# Patient Record
Sex: Female | Born: 1955 | Race: White | Hispanic: No | Smoking: Former smoker
Health system: Southern US, Community
[De-identification: ages and names within clinical notes are randomized; demographics above are authoritative.]

## PROBLEM LIST (undated history)

## (undated) ENCOUNTER — Emergency Department

## (undated) DIAGNOSIS — K219 Gastro-esophageal reflux disease without esophagitis: Secondary | ICD-10-CM

## (undated) DIAGNOSIS — Z85828 Personal history of other malignant neoplasm of skin: Secondary | ICD-10-CM

## (undated) DIAGNOSIS — Z860100 Personal history of colon polyps, unspecified: Secondary | ICD-10-CM

## (undated) DIAGNOSIS — K529 Noninfective gastroenteritis and colitis, unspecified: Secondary | ICD-10-CM

## (undated) DIAGNOSIS — N200 Calculus of kidney: Secondary | ICD-10-CM

## (undated) DIAGNOSIS — Z87442 Personal history of urinary calculi: Secondary | ICD-10-CM

## (undated) DIAGNOSIS — Z8541 Personal history of malignant neoplasm of cervix uteri: Secondary | ICD-10-CM

## (undated) DIAGNOSIS — A630 Anogenital (venereal) warts: Secondary | ICD-10-CM

## (undated) DIAGNOSIS — M1990 Unspecified osteoarthritis, unspecified site: Secondary | ICD-10-CM

## (undated) DIAGNOSIS — Z9889 Other specified postprocedural states: Secondary | ICD-10-CM

## (undated) HISTORY — DX: Personal history of colon polyps, unspecified: Z86.0100

## (undated) HISTORY — PX: ABDOMINAL HYSTERECTOMY: SHX81

## (undated) HISTORY — DX: Unspecified osteoarthritis, unspecified site: M19.90

## (undated) HISTORY — PX: COLONOSCOPY W/ POLYPECTOMY: SHX1380

## (undated) HISTORY — DX: Personal history of colonic polyps: Z86.010

## (undated) HISTORY — PX: MOHS SURGERY: SUR867

---

## 1996-12-31 HISTORY — PX: CERVICAL CONIZATION W/BX: SHX1330

## 1997-12-31 HISTORY — PX: TOTAL ABDOMINAL HYSTERECTOMY W/ BILATERAL SALPINGOOPHORECTOMY: SHX83

## 1997-12-31 HISTORY — PX: EXTRACORPOREAL SHOCK WAVE LITHOTRIPSY: SHX1557

## 1998-05-12 ENCOUNTER — Other Ambulatory Visit: Admission: RE | Admit: 1998-05-12 | Discharge: 1998-05-12 | Payer: Self-pay | Admitting: Obstetrics and Gynecology

## 1998-07-11 ENCOUNTER — Inpatient Hospital Stay (HOSPITAL_COMMUNITY): Admission: RE | Admit: 1998-07-11 | Discharge: 1998-07-16 | Payer: Self-pay | Admitting: Obstetrics and Gynecology

## 1998-07-30 ENCOUNTER — Ambulatory Visit (HOSPITAL_COMMUNITY): Admission: EM | Admit: 1998-07-30 | Discharge: 1998-07-30 | Payer: Self-pay | Admitting: Emergency Medicine

## 1998-08-04 ENCOUNTER — Ambulatory Visit (HOSPITAL_COMMUNITY): Admission: RE | Admit: 1998-08-04 | Discharge: 1998-08-04 | Payer: Self-pay | Admitting: Urology

## 2000-02-19 ENCOUNTER — Other Ambulatory Visit: Admission: RE | Admit: 2000-02-19 | Discharge: 2000-02-19 | Payer: Self-pay | Admitting: Obstetrics and Gynecology

## 2000-03-12 ENCOUNTER — Ambulatory Visit (HOSPITAL_COMMUNITY): Admission: RE | Admit: 2000-03-12 | Discharge: 2000-03-12 | Payer: Self-pay | Admitting: Obstetrics and Gynecology

## 2001-04-24 ENCOUNTER — Other Ambulatory Visit: Admission: RE | Admit: 2001-04-24 | Discharge: 2001-04-24 | Payer: Self-pay | Admitting: Obstetrics and Gynecology

## 2003-12-22 ENCOUNTER — Ambulatory Visit (HOSPITAL_COMMUNITY): Admission: RE | Admit: 2003-12-22 | Discharge: 2003-12-22 | Payer: Self-pay | Admitting: Family Medicine

## 2010-07-24 ENCOUNTER — Emergency Department (HOSPITAL_COMMUNITY): Admission: EM | Admit: 2010-07-24 | Discharge: 2010-07-24 | Payer: Self-pay | Admitting: Family Medicine

## 2010-08-01 ENCOUNTER — Inpatient Hospital Stay (HOSPITAL_COMMUNITY): Admission: EM | Admit: 2010-08-01 | Discharge: 2010-08-04 | Payer: Self-pay | Admitting: Emergency Medicine

## 2010-08-10 ENCOUNTER — Ambulatory Visit: Payer: Self-pay | Admitting: Internal Medicine

## 2010-08-10 LAB — CONVERTED CEMR LAB
Basophils Absolute: 0.1 10*3/uL (ref 0.0–0.1)
Basophils Relative: 1 % (ref 0–1)
CRP: 0.9 mg/dL — ABNORMAL HIGH (ref <0.6)
Eosinophils Absolute: 0.2 10*3/uL (ref 0.0–0.7)
Eosinophils Relative: 3 % (ref 0–5)
Ferritin: 253 ng/mL (ref 10–291)
HCT: 35.9 % — ABNORMAL LOW (ref 36.0–46.0)
Helicobacter Pylori Antibody-IgG: 1.1 — ABNORMAL HIGH
Hemoglobin: 11.3 g/dL — ABNORMAL LOW (ref 12.0–15.0)
Hgb A1c MFr Bld: 5.9 % — ABNORMAL HIGH (ref <5.7)
Iron: 35 ug/dL — ABNORMAL LOW (ref 42–145)
Lymphocytes Relative: 27 % (ref 12–46)
Lymphs Abs: 2.3 10*3/uL (ref 0.7–4.0)
MCHC: 31.5 g/dL (ref 30.0–36.0)
MCV: 95.7 fL (ref 78.0–100.0)
Monocytes Absolute: 0.6 10*3/uL (ref 0.1–1.0)
Monocytes Relative: 7 % (ref 3–12)
Neutro Abs: 5.5 10*3/uL (ref 1.7–7.7)
Neutrophils Relative %: 63 % (ref 43–77)
Platelets: 658 10*3/uL — ABNORMAL HIGH (ref 150–400)
RBC: 3.75 M/uL — ABNORMAL LOW (ref 3.87–5.11)
RDW: 13.5 % (ref 11.5–15.5)
Saturation Ratios: 11 % — ABNORMAL LOW (ref 20–55)
TIBC: 321 ug/dL (ref 250–470)
TSH: 0.507 microintl units/mL (ref 0.350–4.500)
UIBC: 286 ug/dL
Vitamin B-12: 501 pg/mL (ref 211–911)
WBC: 8.7 10*3/uL (ref 4.0–10.5)

## 2010-08-25 ENCOUNTER — Ambulatory Visit: Payer: Self-pay | Admitting: Internal Medicine

## 2010-10-03 ENCOUNTER — Encounter (INDEPENDENT_AMBULATORY_CARE_PROVIDER_SITE_OTHER): Payer: Self-pay | Admitting: Internal Medicine

## 2010-10-17 ENCOUNTER — Ambulatory Visit: Payer: Self-pay | Admitting: Internal Medicine

## 2010-10-24 ENCOUNTER — Emergency Department (HOSPITAL_COMMUNITY): Admission: EM | Admit: 2010-10-24 | Discharge: 2010-10-24 | Payer: Self-pay | Admitting: Family Medicine

## 2010-10-24 ENCOUNTER — Ambulatory Visit (HOSPITAL_COMMUNITY): Admission: RE | Admit: 2010-10-24 | Discharge: 2010-10-24 | Payer: Self-pay | Admitting: Internal Medicine

## 2010-11-06 ENCOUNTER — Encounter: Admission: RE | Admit: 2010-11-06 | Discharge: 2010-11-06 | Payer: Self-pay | Admitting: Internal Medicine

## 2010-11-28 ENCOUNTER — Encounter (INDEPENDENT_AMBULATORY_CARE_PROVIDER_SITE_OTHER): Payer: Self-pay | Admitting: Internal Medicine

## 2010-11-28 LAB — CONVERTED CEMR LAB: Vit D, 25-Hydroxy: 26 ng/mL — ABNORMAL LOW (ref 30–89)

## 2011-03-16 LAB — BASIC METABOLIC PANEL
BUN: 11 mg/dL (ref 6–23)
CO2: 27 mEq/L (ref 19–32)
Calcium: 8.8 mg/dL (ref 8.4–10.5)
Chloride: 102 mEq/L (ref 96–112)
Creatinine, Ser: 0.97 mg/dL (ref 0.4–1.2)
GFR calc Af Amer: >60 mL/min (ref >60)
GFR calc non Af Amer: >60 mL/min (ref >60)
Glucose, Bld: 100 mg/dL — ABNORMAL HIGH (ref 70–99)
Potassium: 3.6 mEq/L (ref 3.5–5.1)
Sodium: 137 mEq/L (ref 135–145)

## 2011-03-16 LAB — CBC
HCT: 25.3 % — ABNORMAL LOW (ref 36.0–46.0)
HCT: 27.8 % — ABNORMAL LOW (ref 36.0–46.0)
HCT: 28.3 % — ABNORMAL LOW (ref 36.0–46.0)
HCT: 32.4 % — ABNORMAL LOW (ref 36.0–46.0)
Hemoglobin: 11.2 g/dL — ABNORMAL LOW (ref 12.0–15.0)
Hemoglobin: 8.8 g/dL — ABNORMAL LOW (ref 12.0–15.0)
Hemoglobin: 9.6 g/dL — ABNORMAL LOW (ref 12.0–15.0)
Hemoglobin: 9.6 g/dL — ABNORMAL LOW (ref 12.0–15.0)
MCH: 31.4 pg (ref 26.0–34.0)
MCH: 31.5 pg (ref 26.0–34.0)
MCH: 31.6 pg (ref 26.0–34.0)
MCHC: 34 g/dL (ref 30.0–36.0)
MCHC: 34.5 g/dL (ref 30.0–36.0)
MCHC: 34.7 g/dL (ref 30.0–36.0)
MCHC: 34.9 g/dL (ref 30.0–36.0)
MCV: 90.1 fL (ref 78.0–100.0)
MCV: 91.2 fL (ref 78.0–100.0)
Platelets: 244 10*3/uL (ref 150–400)
Platelets: 308 10*3/uL (ref 150–400)
RBC: 2.81 MIL/uL — ABNORMAL LOW (ref 3.87–5.11)
RBC: 3.09 MIL/uL — ABNORMAL LOW (ref 3.87–5.11)
RBC: 3.55 MIL/uL — ABNORMAL LOW (ref 3.87–5.11)
RDW: 12.8 % (ref 11.5–15.5)
RDW: 12.8 % (ref 11.5–15.5)
RDW: 12.8 % (ref 11.5–15.5)
WBC: 15.3 10*3/uL — ABNORMAL HIGH (ref 4.0–10.5)
WBC: 22.3 10*3/uL — ABNORMAL HIGH (ref 4.0–10.5)
WBC: 9.3 10*3/uL (ref 4.0–10.5)

## 2011-03-16 LAB — DIFFERENTIAL
Basophils Absolute: 0 10*3/uL (ref 0.0–0.1)
Basophils Absolute: 0 10*3/uL (ref 0.0–0.1)
Basophils Relative: 0 % (ref 0–1)
Basophils Relative: 0 % (ref 0–1)
Eosinophils Absolute: 0 10*3/uL (ref 0.0–0.7)
Eosinophils Absolute: 0.1 10*3/uL (ref 0.0–0.7)
Eosinophils Relative: 0 % (ref 0–5)
Eosinophils Relative: 1 % (ref 0–5)
Lymphocytes Relative: 10 % — ABNORMAL LOW (ref 12–46)
Lymphocytes Relative: 8 % — ABNORMAL LOW (ref 12–46)
Lymphs Abs: 1.5 10*3/uL (ref 0.7–4.0)
Lymphs Abs: 1.7 10*3/uL (ref 0.7–4.0)
Monocytes Absolute: 1.4 10*3/uL — ABNORMAL HIGH (ref 0.1–1.0)
Monocytes Absolute: 1.4 10*3/uL — ABNORMAL HIGH (ref 0.1–1.0)
Monocytes Relative: 6 % (ref 3–12)
Monocytes Relative: 9 % (ref 3–12)
Neutro Abs: 12.2 10*3/uL — ABNORMAL HIGH (ref 1.7–7.7)
Neutro Abs: 19.1 10*3/uL — ABNORMAL HIGH (ref 1.7–7.7)
Neutrophils Relative %: 80 % — ABNORMAL HIGH (ref 43–77)
Neutrophils Relative %: 86 % — ABNORMAL HIGH (ref 43–77)

## 2011-03-16 LAB — URINE CULTURE
Colony Count: >=100000
Culture  Setup Time: 201108030057

## 2011-03-16 LAB — URINALYSIS, ROUTINE W REFLEX MICROSCOPIC
Bilirubin Urine: NEGATIVE
Glucose, UA: NEGATIVE mg/dL
Ketones, ur: NEGATIVE mg/dL
Nitrite: NEGATIVE
Protein, ur: 100 mg/dL — AB
Specific Gravity, Urine: 1.019 (ref 1.005–1.030)
Urobilinogen, UA: 1 mg/dL (ref 0.0–1.0)
pH: 5.5 (ref 5.0–8.0)

## 2011-03-16 LAB — TSH: TSH: 0.859 u[IU]/mL (ref 0.350–4.500)

## 2011-03-16 LAB — IRON AND TIBC
Iron: 12 ug/dL — ABNORMAL LOW (ref 42–135)
Saturation Ratios: 7 % — ABNORMAL LOW (ref 20–55)
TIBC: 170 ug/dL — ABNORMAL LOW (ref 250–470)
UIBC: 158 ug/dL

## 2011-03-16 LAB — COMPREHENSIVE METABOLIC PANEL
ALT: 16 U/L (ref 0–35)
BUN: 4 mg/dL — ABNORMAL LOW (ref 6–23)
CO2: 25 mEq/L (ref 19–32)
Calcium: 8.5 mg/dL (ref 8.4–10.5)
Creatinine, Ser: 0.84 mg/dL (ref 0.4–1.2)
GFR calc non Af Amer: >60 mL/min (ref >60)
Glucose, Bld: 107 mg/dL — ABNORMAL HIGH (ref 70–99)
Sodium: 143 mEq/L (ref 135–145)
Total Protein: 6.2 g/dL (ref 6.0–8.3)

## 2011-03-16 LAB — FOLATE: Folate: >20 ng/mL

## 2011-03-16 LAB — CULTURE, BLOOD (ROUTINE X 2)
Culture: NO GROWTH
Culture: NO GROWTH

## 2011-03-16 LAB — URINE MICROSCOPIC-ADD ON

## 2011-03-16 LAB — VITAMIN B12: Vitamin B-12: 508 pg/mL (ref 211–911)

## 2011-03-16 LAB — MRSA PCR SCREENING: MRSA by PCR: NEGATIVE

## 2011-03-16 LAB — T4, FREE: Free T4: 1.16 ng/dL (ref 0.80–1.80)

## 2011-03-16 LAB — HEMOCCULT GUIAC POC 1CARD (OFFICE): Fecal Occult Bld: NEGATIVE

## 2011-03-16 LAB — RETICULOCYTES: Retic Ct Pct: 1 % (ref 0.4–3.1)

## 2013-10-22 ENCOUNTER — Other Ambulatory Visit: Payer: Self-pay | Admitting: Internal Medicine

## 2013-10-22 DIAGNOSIS — M549 Dorsalgia, unspecified: Secondary | ICD-10-CM

## 2013-10-22 DIAGNOSIS — Z1231 Encounter for screening mammogram for malignant neoplasm of breast: Secondary | ICD-10-CM

## 2013-10-22 DIAGNOSIS — R1011 Right upper quadrant pain: Secondary | ICD-10-CM

## 2013-10-29 ENCOUNTER — Other Ambulatory Visit (HOSPITAL_COMMUNITY): Payer: Self-pay | Admitting: Internal Medicine

## 2013-10-29 ENCOUNTER — Other Ambulatory Visit: Payer: Self-pay | Admitting: Internal Medicine

## 2013-10-29 ENCOUNTER — Ambulatory Visit
Admission: RE | Admit: 2013-10-29 | Discharge: 2013-10-29 | Disposition: A | Discharge status: Home / Self Care | Payer: BC Managed Care – PPO | Source: Ambulatory Visit | Attending: Internal Medicine | Admitting: Internal Medicine

## 2013-10-29 DIAGNOSIS — R140 Abdominal distension (gaseous): Secondary | ICD-10-CM

## 2013-10-29 DIAGNOSIS — M549 Dorsalgia, unspecified: Secondary | ICD-10-CM

## 2013-10-29 DIAGNOSIS — R1011 Right upper quadrant pain: Secondary | ICD-10-CM

## 2013-10-29 DIAGNOSIS — M545 Low back pain: Secondary | ICD-10-CM

## 2013-11-03 ENCOUNTER — Ambulatory Visit
Admission: RE | Admit: 2013-11-03 | Discharge: 2013-11-03 | Disposition: A | Discharge status: Home / Self Care | Payer: BC Managed Care – PPO | Source: Ambulatory Visit | Attending: Internal Medicine | Admitting: Internal Medicine

## 2013-11-03 DIAGNOSIS — Z1231 Encounter for screening mammogram for malignant neoplasm of breast: Secondary | ICD-10-CM

## 2013-11-05 ENCOUNTER — Other Ambulatory Visit: Payer: Self-pay | Admitting: Internal Medicine

## 2013-11-05 DIAGNOSIS — R928 Other abnormal and inconclusive findings on diagnostic imaging of breast: Secondary | ICD-10-CM

## 2013-11-10 ENCOUNTER — Encounter (HOSPITAL_COMMUNITY)
Admission: RE | Admit: 2013-11-10 | Discharge: 2013-11-10 | Disposition: A | Discharge status: Home / Self Care | Payer: BC Managed Care – PPO | Source: Ambulatory Visit | Attending: Internal Medicine | Admitting: Internal Medicine

## 2013-11-10 DIAGNOSIS — R140 Abdominal distension (gaseous): Secondary | ICD-10-CM

## 2013-11-10 DIAGNOSIS — R141 Gas pain: Secondary | ICD-10-CM | POA: Insufficient documentation

## 2013-11-10 DIAGNOSIS — R112 Nausea with vomiting, unspecified: Secondary | ICD-10-CM | POA: Insufficient documentation

## 2013-11-10 DIAGNOSIS — R1011 Right upper quadrant pain: Secondary | ICD-10-CM | POA: Insufficient documentation

## 2013-11-10 DIAGNOSIS — R142 Eructation: Secondary | ICD-10-CM | POA: Insufficient documentation

## 2013-11-10 MED ORDER — TECHNETIUM TC 99M MEBROFENIN IV KIT
5.0000 | PACK | Freq: Once | INTRAVENOUS | Status: AC | PRN
  Administered 2013-11-10: 5 via INTRAVENOUS

## 2013-11-10 MED ORDER — SINCALIDE 5 MCG IJ SOLR
0.0200 ug/kg | Freq: Once | INTRAMUSCULAR | Status: AC
  Administered 2013-11-10: 1.14 ug via INTRAVENOUS

## 2013-11-10 MED ORDER — SINCALIDE 5 MCG IJ SOLR
INTRAMUSCULAR | Status: AC
  Administered 2013-11-10: 1.14 ug via INTRAVENOUS
  Filled 2013-11-10: qty 5

## 2013-11-25 ENCOUNTER — Other Ambulatory Visit: Payer: BC Managed Care – PPO

## 2013-11-30 ENCOUNTER — Ambulatory Visit
Admission: RE | Admit: 2013-11-30 | Discharge: 2013-11-30 | Disposition: A | Discharge status: Home / Self Care | Payer: BC Managed Care – PPO | Source: Ambulatory Visit | Attending: Internal Medicine | Admitting: Internal Medicine

## 2013-11-30 DIAGNOSIS — R928 Other abnormal and inconclusive findings on diagnostic imaging of breast: Secondary | ICD-10-CM

## 2013-12-07 ENCOUNTER — Ambulatory Visit (INDEPENDENT_AMBULATORY_CARE_PROVIDER_SITE_OTHER): Payer: Self-pay | Admitting: General Surgery

## 2013-12-11 ENCOUNTER — Encounter (INDEPENDENT_AMBULATORY_CARE_PROVIDER_SITE_OTHER): Payer: Self-pay

## 2013-12-14 ENCOUNTER — Ambulatory Visit (INDEPENDENT_AMBULATORY_CARE_PROVIDER_SITE_OTHER): Payer: BC Managed Care – PPO | Admitting: General Surgery

## 2013-12-14 ENCOUNTER — Ambulatory Visit (INDEPENDENT_AMBULATORY_CARE_PROVIDER_SITE_OTHER): Payer: Self-pay | Admitting: General Surgery

## 2013-12-14 ENCOUNTER — Encounter (INDEPENDENT_AMBULATORY_CARE_PROVIDER_SITE_OTHER): Payer: Self-pay | Admitting: General Surgery

## 2013-12-14 ENCOUNTER — Encounter (INDEPENDENT_AMBULATORY_CARE_PROVIDER_SITE_OTHER): Payer: Self-pay

## 2013-12-14 ENCOUNTER — Telehealth (INDEPENDENT_AMBULATORY_CARE_PROVIDER_SITE_OTHER): Payer: Self-pay | Admitting: General Surgery

## 2013-12-14 VITALS — BP 126/78 | HR 72 | Temp 98.2°F | Resp 14 | Ht 64.5 in | Wt 129.4 lb

## 2013-12-14 DIAGNOSIS — K811 Chronic cholecystitis: Secondary | ICD-10-CM

## 2013-12-14 NOTE — Patient Instructions (Signed)

## 2013-12-14 NOTE — Telephone Encounter (Signed)
Patient met with surgery scheduling, went over financial responsibilities, patient will call back to schedule °

## 2013-12-15 DIAGNOSIS — K811 Chronic cholecystitis: Secondary | ICD-10-CM | POA: Insufficient documentation

## 2013-12-15 NOTE — Assessment & Plan Note (Signed)
Despite having a normal ultrasound and a gallbladder ejection fraction of 32%, I do think the patient has chronic cholecystitis based on her tenderness and the replication of symptoms with CCK infusion.  I advised the patient that she would most likely benefit from cholecystectomy. I did state that in patients with atypical symptoms or normal radiologic studies, that there is a small proportion of people that does not improve with cholecystectomy. I also advised her that the bloating and belching may not improve. These frequently improve with cholecystectomy, but these can be related to other causes. Her main target would be to improve her pain and nausea.  The surgical procedure was described to the patient in detail.  The patient was given Agricultural engineer. .  I discussed the incision type and location, the location of the gallbladder, the anatomy of the bile ducts and arteries, and the typical progression of surgery.  I discussed the possibility of converting to an open operation.  I advised of the risks of bleeding, infection, damage to other structures (such as the bile duct, intestine or liver), bile leak, need for other procedures or surgeries, and post op diarrhea/constipation.  We discussed the risk of blood clot.  We discussed the recovery period and post operative restrictions.  The patient was advised against taking blood thinners the week before surgery.

## 2013-12-15 NOTE — Progress Notes (Signed)
Chief Complaint  Patient presents with  . New Evaluation    eval GB ... low EF    HISTORY: Patient is a 57-year-old female who presents with abdominal pain, nausea and vomiting. She states that she has had belching for several years. However, since September she is started having significant increase in this in addition to episodes of severe right upper quadrant epigastric pain that are associated with nausea and vomiting. She states that some of these have been postprandial in nature but that they can occur any time. She has noticed that milk seems to make this worse and has taken this out of her diet. She has a sister and her mother that have both had gallbladder disease in addition to a maternal aunt and a maternal niece. She states that her sister had the dysfunction of the gallbladder, however her mother had severe infection and required exploratory laparotomy with cholecystectomy. The patient denies fevers and chills. She has not had any diarrhea or other changes in her bowel habits.  Past Medical History  Diagnosis Date  . Biliary dyskinesia   . RUQ pain   . Abdominal pain   . Nausea and vomiting   . History of colon polyps   . Bloating   . Anemia   . Arthritis   . Cancer     cervical , basal cell carcinoma    Past Surgical History  Procedure Laterality Date  . Colonoscopy    . Colonoscopy w/ polypectomy      2 x  . Abdominal hysterectomy    . Nose surgery      basil cell carcinoma removed   . Laser ablation/cauterization of endometrial implants      No current outpatient prescriptions on file.   No current facility-administered medications for this visit.     Allergies  Allergen Reactions  . Penicillins      Family History  Problem Relation Age of Onset  . Cancer Mother     uterus  . Cancer Paternal Aunt     breast     History   Social History  . Marital Status: Divorced    Spouse Name: N/A    Number of Children: N/A  . Years of Education: N/A    Social History Main Topics  . Smoking status: Former Smoker    Quit date: 01/01/2012  . Smokeless tobacco: Never Used  . Alcohol Use: Yes     Comment: occasionally  . Drug Use: No  . Sexual Activity: None    REVIEW OF SYSTEMS - PERTINENT POSITIVES ONLY: 12 point review of systems negative other than HPI and PMH except for abdominal distention  EXAM: Filed Vitals:   12/14/13 1017  BP: 126/78  Pulse: 72  Temp: 98.2 F (36.8 C)  Resp: 14   Filed Weights   12/14/13 1017  Weight: 129 lb 6.1 oz (58.686 kg)     Gen:  No acute distress.  Well nourished and well groomed.   Neurological: Alert and oriented to person, place, and time. Coordination normal.  Head: Normocephalic and atraumatic.  Eyes: Conjunctivae are normal. Pupils are equal, round, and reactive to light. No scleral icterus.  Neck: Normal range of motion. Neck supple. No tracheal deviation or thyromegaly present.  Cardiovascular: Normal rate, regular rhythm, normal heart sounds and intact distal pulses.  Exam reveals no gallop and no friction rub.  No murmur heard. Respiratory: Effort normal.  No respiratory distress. No chest wall tenderness. Breath sounds normal.  No wheezes,   rales or rhonchi.  GI: Soft. Bowel sounds are normal. The abdomen is soft and nondistended.  There is RUQ tenderness.  There is no rebound and no guarding.  Musculoskeletal: Normal range of motion. Extremities are nontender.  Lymphadenopathy: No cervical, preauricular, postauricular or axillary adenopathy is present Skin: Skin is warm and dry. No rash noted. No diaphoresis. No erythema. No pallor. No clubbing, cyanosis, or edema.   Psychiatric: Normal mood and affect. Behavior is normal. Judgment and thought content normal.    LABORATORY RESULTS: Available labs are reviewed    RADIOLOGY RESULTS: See E-Chart or I-Site for most recent results.  Images and reports are reviewed.    ASSESSMENT AND PLAN: Chronic cholecystitis Despite  having a normal ultrasound and a gallbladder ejection fraction of 32%, I do think the patient has chronic cholecystitis based on her tenderness and the replication of symptoms with CCK infusion.  I advised the patient that she would most likely benefit from cholecystectomy. I did state that in patients with atypical symptoms or normal radiologic studies, that there is a small proportion of people that does not improve with cholecystectomy. I also advised her that the bloating and belching may not improve. These frequently improve with cholecystectomy, but these can be related to other causes. Her main target would be to improve her pain and nausea.  The surgical procedure was described to the patient in detail.  The patient was given educational material. .  I discussed the incision type and location, the location of the gallbladder, the anatomy of the bile ducts and arteries, and the typical progression of surgery.  I discussed the possibility of converting to an open operation.  I advised of the risks of bleeding, infection, damage to other structures (such as the bile duct, intestine or liver), bile leak, need for other procedures or surgeries, and post op diarrhea/constipation.  We discussed the risk of blood clot.  We discussed the recovery period and post operative restrictions.  The patient was advised against taking blood thinners the week before surgery.      40 min spent in evaluation, examination, and counseling.  >50% of visit spent in counseling.      Nikiah Goin L Davaris Youtsey MD Surgical Oncology, General and Endocrine Surgery [facility]      Visit Diagnoses: 1. Chronic cholecystitis     Primary Care Physician: MOREIRA,ROY, MD    

## 2013-12-17 ENCOUNTER — Encounter (HOSPITAL_COMMUNITY): Payer: Self-pay | Admitting: Pharmacy Technician

## 2013-12-17 NOTE — Patient Instructions (Addendum)
21 Brittney Santos  12/17/2013   Your procedure is scheduled on: Tuesday December 30th  Report to [facility] at 930  AM.  Call this number if you have problems the morning of surgery [phone]   Remember:   Do not eat food or drink liquids :After Midnight.     Take these medicines the morning of surgery with A SIP OF WATER: no medications to take                                SEE [facility] PREPARING FOR SURGERY SHEET             You may not have any metal on your body including hair pins and piercings  Do not wear jewelry, make-up.  Do not wear lotions, powders, or perfumes. You may wear deodorant.   Men may shave face and neck.  Do not bring valuables to the hospital. [facility] IS NOT RESPONSIBLE FOR VALUEABLES.  Contacts, dentures or bridgework may not be worn into surgery.  Leave suitcase in the car. After surgery it may be brought to your room.  For patients admitted to the hospital, checkout time is 11:00 AM the day of discharge.   Patients discharged the day of surgery will not be allowed to drive home.  Name and phone number of your driver: sherry adams sister cell [phone]  Special Instructions: N/A   Please read over the following fact sheets that you were given: [facility] preparing for surgery sheet  Call Cain Sieve RN pre op nurse if needed [phone]    FAILURE TO FOLLOW THESE INSTRUCTIONS MAY RESULT IN THE CANCELLATION OF YOUR SURGERY.  PATIENT SIGNATURE___________________________________________  NURSE SIGNATURE_____________________________________________

## 2013-12-17 NOTE — Progress Notes (Signed)
ekg dr Ludwig Clarks on chart

## 2013-12-18 ENCOUNTER — Encounter (HOSPITAL_COMMUNITY): Payer: Self-pay

## 2013-12-18 ENCOUNTER — Encounter (HOSPITAL_COMMUNITY)
Admission: RE | Admit: 2013-12-18 | Discharge: 2013-12-18 | Disposition: A | Discharge status: Home / Self Care | Payer: BC Managed Care – PPO | Source: Ambulatory Visit | Attending: General Surgery | Admitting: General Surgery

## 2013-12-18 DIAGNOSIS — Z01812 Encounter for preprocedural laboratory examination: Secondary | ICD-10-CM | POA: Insufficient documentation

## 2013-12-18 HISTORY — DX: Personal history of urinary calculi: Z87.442

## 2013-12-18 LAB — CBC WITH DIFFERENTIAL/PLATELET
Eosinophils Relative: 4 % (ref 0–5)
HCT: 35.7 % — ABNORMAL LOW (ref 36.0–46.0)
Hemoglobin: 11.9 g/dL — ABNORMAL LOW (ref 12.0–15.0)
Lymphocytes Relative: 32 % (ref 12–46)
Lymphs Abs: 1.7 10*3/uL (ref 0.7–4.0)
MCH: 29.2 pg (ref 26.0–34.0)
MCHC: 33.3 g/dL (ref 30.0–36.0)
MCV: 87.5 fL (ref 78.0–100.0)
Monocytes Relative: 10 % (ref 3–12)
Neutro Abs: 2.9 10*3/uL (ref 1.7–7.7)
Neutrophils Relative %: 54 % (ref 43–77)
RBC: 4.08 MIL/uL (ref 3.87–5.11)

## 2013-12-18 LAB — URINALYSIS, ROUTINE W REFLEX MICROSCOPIC
Bilirubin Urine: NEGATIVE
Glucose, UA: NEGATIVE mg/dL
Ketones, ur: NEGATIVE mg/dL
Nitrite: NEGATIVE
Protein, ur: NEGATIVE mg/dL

## 2013-12-18 LAB — COMPREHENSIVE METABOLIC PANEL
AST: 17 U/L (ref 0–37)
Alkaline Phosphatase: 109 U/L (ref 39–117)
BUN: 10 mg/dL (ref 6–23)
Chloride: 105 mEq/L (ref 96–112)
Creatinine, Ser: 0.74 mg/dL (ref 0.50–1.10)
GFR calc Af Amer: >90 mL/min (ref >90)
Glucose, Bld: 88 mg/dL (ref 70–99)
Potassium: 4.6 mEq/L (ref 3.5–5.1)
Total Bilirubin: 0.3 mg/dL (ref 0.3–1.2)
Total Protein: 7.4 g/dL (ref 6.0–8.3)

## 2013-12-18 LAB — URINE MICROSCOPIC-ADD ON

## 2013-12-18 NOTE — Progress Notes (Signed)
Micro ua results routed by epic to dr Donell Beers inbox

## 2013-12-29 ENCOUNTER — Ambulatory Visit (HOSPITAL_COMMUNITY)
Admission: RE | Admit: 2013-12-29 | Discharge: 2013-12-29 | Disposition: A | Discharge status: Home / Self Care | Payer: BC Managed Care – PPO | Source: Ambulatory Visit | Attending: General Surgery | Admitting: General Surgery

## 2013-12-29 ENCOUNTER — Ambulatory Visit (HOSPITAL_COMMUNITY): Payer: BC Managed Care – PPO | Admitting: Anesthesiology

## 2013-12-29 ENCOUNTER — Encounter (HOSPITAL_COMMUNITY): Payer: BC Managed Care – PPO | Admitting: Anesthesiology

## 2013-12-29 ENCOUNTER — Encounter (HOSPITAL_COMMUNITY)
Admission: RE | Disposition: A | Discharge status: Home / Self Care | Payer: Self-pay | Source: Ambulatory Visit | Attending: General Surgery

## 2013-12-29 ENCOUNTER — Encounter (HOSPITAL_COMMUNITY): Payer: Self-pay | Admitting: *Deleted

## 2013-12-29 DIAGNOSIS — K811 Chronic cholecystitis: Secondary | ICD-10-CM | POA: Insufficient documentation

## 2013-12-29 DIAGNOSIS — Z8673 Personal history of transient ischemic attack (TIA), and cerebral infarction without residual deficits: Secondary | ICD-10-CM | POA: Insufficient documentation

## 2013-12-29 DIAGNOSIS — Z8541 Personal history of malignant neoplasm of cervix uteri: Secondary | ICD-10-CM | POA: Insufficient documentation

## 2013-12-29 HISTORY — PX: CHOLECYSTECTOMY: SHX55

## 2013-12-29 SURGERY — LAPAROSCOPIC CHOLECYSTECTOMY
Anesthesia: General | Site: Abdomen

## 2013-12-29 MED ORDER — KETOROLAC TROMETHAMINE 30 MG/ML IJ SOLN: 15.0000 mg | Freq: Once | INTRAMUSCULAR | Status: DC | PRN

## 2013-12-29 MED ORDER — LIDOCAINE HCL 1 % IJ SOLN
INTRAMUSCULAR | Status: AC
  Filled 2013-12-29: qty 20

## 2013-12-29 MED ORDER — FENTANYL CITRATE 0.05 MG/ML IJ SOLN
INTRAMUSCULAR | Status: DC | PRN
  Administered 2013-12-29: 100 ug via INTRAVENOUS
  Administered 2013-12-29: 50 ug via INTRAVENOUS
  Administered 2013-12-29: 100 ug via INTRAVENOUS

## 2013-12-29 MED ORDER — FENTANYL CITRATE 0.05 MG/ML IJ SOLN
INTRAMUSCULAR | Status: AC
  Filled 2013-12-29: qty 5

## 2013-12-29 MED ORDER — ATROPINE SULFATE 0.4 MG/ML IJ SOLN
INTRAMUSCULAR | Status: DC | PRN
  Administered 2013-12-29: 0.4 mg via INTRAVENOUS

## 2013-12-29 MED ORDER — HYDROMORPHONE HCL PF 1 MG/ML IJ SOLN: 0.2500 mg | INTRAMUSCULAR | Status: DC | PRN

## 2013-12-29 MED ORDER — ROCURONIUM BROMIDE 100 MG/10ML IV SOLN
INTRAVENOUS | Status: AC
  Filled 2013-12-29: qty 1

## 2013-12-29 MED ORDER — BUPIVACAINE-EPINEPHRINE 0.25% -1:200000 IJ SOLN
INTRAMUSCULAR | Status: DC | PRN
  Administered 2013-12-29: 9 mL

## 2013-12-29 MED ORDER — LACTATED RINGERS IR SOLN
Status: DC | PRN
  Administered 2013-12-29: 1000 mL

## 2013-12-29 MED ORDER — DEXAMETHASONE SODIUM PHOSPHATE 10 MG/ML IJ SOLN
INTRAMUSCULAR | Status: DC | PRN
  Administered 2013-12-29: 10 mg via INTRAVENOUS

## 2013-12-29 MED ORDER — EPHEDRINE SULFATE 50 MG/ML IJ SOLN
INTRAMUSCULAR | Status: DC | PRN
  Administered 2013-12-29: 10 mg via INTRAVENOUS
  Administered 2013-12-29: 5 mg via INTRAVENOUS

## 2013-12-29 MED ORDER — CIPROFLOXACIN IN D5W 400 MG/200ML IV SOLN
INTRAVENOUS | Status: AC
  Filled 2013-12-29: qty 200

## 2013-12-29 MED ORDER — DEXAMETHASONE SODIUM PHOSPHATE 10 MG/ML IJ SOLN
INTRAMUSCULAR | Status: AC
  Filled 2013-12-29: qty 1

## 2013-12-29 MED ORDER — MIDAZOLAM HCL 5 MG/5ML IJ SOLN
INTRAMUSCULAR | Status: DC | PRN
  Administered 2013-12-29: 2 mg via INTRAVENOUS

## 2013-12-29 MED ORDER — CIPROFLOXACIN IN D5W 400 MG/200ML IV SOLN
400.0000 mg | INTRAVENOUS | Status: AC
  Administered 2013-12-29: 400 mg via INTRAVENOUS

## 2013-12-29 MED ORDER — ESMOLOL HCL 10 MG/ML IV SOLN
INTRAVENOUS | Status: DC | PRN
  Administered 2013-12-29 (×2): 20 mg via INTRAVENOUS

## 2013-12-29 MED ORDER — PROPOFOL 10 MG/ML IV BOLUS
INTRAVENOUS | Status: DC | PRN
  Administered 2013-12-29: 200 mg via INTRAVENOUS

## 2013-12-29 MED ORDER — PROMETHAZINE HCL 25 MG/ML IJ SOLN: 6.2500 mg | INTRAMUSCULAR | Status: DC | PRN

## 2013-12-29 MED ORDER — ESMOLOL HCL 10 MG/ML IV SOLN
INTRAVENOUS | Status: AC
  Filled 2013-12-29: qty 10

## 2013-12-29 MED ORDER — GLYCOPYRROLATE 0.2 MG/ML IJ SOLN
INTRAMUSCULAR | Status: DC | PRN
  Administered 2013-12-29: 0.4 mg via INTRAVENOUS

## 2013-12-29 MED ORDER — LIDOCAINE HCL (CARDIAC) 20 MG/ML IV SOLN
INTRAVENOUS | Status: AC
  Filled 2013-12-29: qty 5

## 2013-12-29 MED ORDER — LACTATED RINGERS IV SOLN
INTRAVENOUS | Status: DC
  Administered 2013-12-29: 12:00:00 via INTRAVENOUS

## 2013-12-29 MED ORDER — GLYCOPYRROLATE 0.2 MG/ML IJ SOLN
INTRAMUSCULAR | Status: AC
  Filled 2013-12-29: qty 2

## 2013-12-29 MED ORDER — LIDOCAINE HCL (PF) 2 % IJ SOLN
INTRAMUSCULAR | Status: DC | PRN
  Administered 2013-12-29: 75 mg via INTRADERMAL

## 2013-12-29 MED ORDER — LIDOCAINE HCL 1 % IJ SOLN
INTRAMUSCULAR | Status: DC | PRN
  Administered 2013-12-29: 9 mL

## 2013-12-29 MED ORDER — OXYCODONE-ACETAMINOPHEN 5-325 MG PO TABS: 1.0000 | ORAL_TABLET | ORAL | Status: DC | PRN

## 2013-12-29 MED ORDER — MIDAZOLAM HCL 2 MG/2ML IJ SOLN
INTRAMUSCULAR | Status: AC
  Filled 2013-12-29: qty 2

## 2013-12-29 MED ORDER — BUPIVACAINE-EPINEPHRINE PF 0.25-1:200000 % IJ SOLN
INTRAMUSCULAR | Status: AC
  Filled 2013-12-29: qty 30

## 2013-12-29 MED ORDER — ONDANSETRON HCL 4 MG/2ML IJ SOLN
INTRAMUSCULAR | Status: AC
  Filled 2013-12-29: qty 2

## 2013-12-29 MED ORDER — PROPOFOL 10 MG/ML IV BOLUS
INTRAVENOUS | Status: AC
  Filled 2013-12-29: qty 20

## 2013-12-29 MED ORDER — LACTATED RINGERS IV SOLN
INTRAVENOUS | Status: DC | PRN
  Administered 2013-12-29: 10:00:00 via INTRAVENOUS

## 2013-12-29 MED ORDER — ONDANSETRON HCL 4 MG/2ML IJ SOLN
INTRAMUSCULAR | Status: DC | PRN
  Administered 2013-12-29: 4 mg via INTRAVENOUS

## 2013-12-29 MED ORDER — NEOSTIGMINE METHYLSULFATE 1 MG/ML IJ SOLN
INTRAMUSCULAR | Status: DC | PRN
  Administered 2013-12-29: 4 mg via INTRAVENOUS

## 2013-12-29 MED ORDER — 0.9 % SODIUM CHLORIDE (POUR BTL) OPTIME
TOPICAL | Status: DC | PRN
  Administered 2013-12-29: 1000 mL

## 2013-12-29 MED ORDER — ROCURONIUM BROMIDE 100 MG/10ML IV SOLN
INTRAVENOUS | Status: DC | PRN
  Administered 2013-12-29: 40 mg via INTRAVENOUS
  Administered 2013-12-29: 10 mg via INTRAVENOUS

## 2013-12-29 SURGICAL SUPPLY — 35 items
APPLIER CLIP 5 13 M/L LIGAMAX5 (MISCELLANEOUS)
APPLIER CLIP ROT 10 11.4 M/L (STAPLE)
CANISTER SUCTION 2500CC (MISCELLANEOUS) ×2 IMPLANT
CHLORAPREP W/TINT 26ML (MISCELLANEOUS) ×2 IMPLANT
CLIP APPLIE 5 13 M/L LIGAMAX5 (MISCELLANEOUS) IMPLANT
CLIP APPLIE ROT 10 11.4 M/L (STAPLE) IMPLANT
CLIP LIGATING HEMO O LOK GREEN (MISCELLANEOUS) IMPLANT
COVER MAYO STAND STRL (DRAPES) IMPLANT
DECANTER SPIKE VIAL GLASS SM (MISCELLANEOUS) ×2 IMPLANT
DERMABOND ADVANCED (GAUZE/BANDAGES/DRESSINGS) ×1
DERMABOND ADVANCED .7 DNX12 (GAUZE/BANDAGES/DRESSINGS) ×1 IMPLANT
DRAPE C-ARM 42X120 X-RAY (DRAPES) IMPLANT
DRAPE LAPAROSCOPIC ABDOMINAL (DRAPES) ×2 IMPLANT
DRAPE UTILITY XL STRL (DRAPES) ×2 IMPLANT
ELECT REM PT RETURN 9FT ADLT (ELECTROSURGICAL) ×2
ELECTRODE REM PT RTRN 9FT ADLT (ELECTROSURGICAL) ×1 IMPLANT
GLOVE BIO SURGEON STRL SZ 6 (GLOVE) ×2 IMPLANT
GLOVE INDICATOR 6.5 STRL GRN (GLOVE) ×2 IMPLANT
GOWN BRE IMP PREV XXLGXLNG (GOWN DISPOSABLE) ×2 IMPLANT
GOWN STRL REIN XL XLG (GOWN DISPOSABLE) ×4 IMPLANT
KIT BASIN OR (CUSTOM PROCEDURE TRAY) ×2 IMPLANT
POUCH SPECIMEN RETRIEVAL 10MM (ENDOMECHANICALS) ×2 IMPLANT
SCISSORS ENDO CVD 5DCS (MISCELLANEOUS) ×2 IMPLANT
SET CHOLANGIOGRAPH MIX (MISCELLANEOUS) IMPLANT
SET IRRIG TUBING LAPAROSCOPIC (IRRIGATION / IRRIGATOR) ×2 IMPLANT
SOLUTION ANTI FOG 6CC (MISCELLANEOUS) ×2 IMPLANT
SUT MNCRL AB 4-0 PS2 18 (SUTURE) ×2 IMPLANT
TOWEL OR 17X26 10 PK STRL BLUE (TOWEL DISPOSABLE) ×2 IMPLANT
TOWEL OR NON WOVEN STRL DISP B (DISPOSABLE) ×2 IMPLANT
TRAY LAP CHOLE (CUSTOM PROCEDURE TRAY) ×2 IMPLANT
TROCAR BLADELESS OPT 5 75 (ENDOMECHANICALS) ×2 IMPLANT
TROCAR SLEEVE XCEL 5X75 (ENDOMECHANICALS) ×2 IMPLANT
TROCAR XCEL BLUNT TIP 100MML (ENDOMECHANICALS) ×2 IMPLANT
TROCAR XCEL NON-BLD 11X100MML (ENDOMECHANICALS) IMPLANT
TUBING INSUFFLATION 10FT LAP (TUBING) ×2 IMPLANT

## 2013-12-29 NOTE — Anesthesia Preprocedure Evaluation (Signed)
Anesthesia Evaluation  Patient identified by MRN, date of birth, ID band Patient awake    Reviewed: Allergy & Precautions, H&P , NPO status , Patient's Chart, lab work & pertinent test results  Airway Mallampati: II TM Distance: >3 FB Neck ROM: Full    Dental no notable dental hx.    Pulmonary neg pulmonary ROS, former smoker,  breath sounds clear to auscultation  Pulmonary exam normal       Cardiovascular negative cardio ROS  Rhythm:Regular Rate:Normal     Neuro/Psych negative neurological ROS  negative psych ROS   GI/Hepatic negative GI ROS, Neg liver ROS,   Endo/Other  negative endocrine ROS  Renal/GU negative Renal ROS  negative genitourinary   Musculoskeletal negative musculoskeletal ROS (+)   Abdominal   Peds negative pediatric ROS (+)  Hematology negative hematology ROS (+)   Anesthesia Other Findings   Reproductive/Obstetrics negative OB ROS                           Anesthesia Physical Anesthesia Plan  ASA: I  Anesthesia Plan: General   Post-op Pain Management:    Induction: Intravenous  Airway Management Planned: Oral ETT  Additional Equipment:   Intra-op Plan:   Post-operative Plan: Extubation in OR  Informed Consent: I have reviewed the patients History and Physical, chart, labs and discussed the procedure including the risks, benefits and alternatives for the proposed anesthesia with the patient or authorized representative who has indicated his/her understanding and acceptance.   Dental advisory given  Plan Discussed with: CRNA and Surgeon  Anesthesia Plan Comments:         Anesthesia Quick Evaluation  

## 2013-12-29 NOTE — H&P (View-Only) (Signed)
Chief Complaint  Patient presents with  . New Evaluation    eval GB .Marland Kitchen. low EF    HISTORY: Patient is a 57 year old female who presents with abdominal pain, nausea and vomiting. She states that she has had belching for several years. However, since September she is started having significant increase in this in addition to episodes of severe right upper quadrant epigastric pain that are associated with nausea and vomiting. She states that some of these have been postprandial in nature but that they can occur any time. She has noticed that milk seems to make this worse and has taken this out of her diet. She has a sister and her mother that have both had gallbladder disease in addition to a maternal aunt and a maternal niece. She states that her sister had the dysfunction of the gallbladder, however her mother had severe infection and required exploratory laparotomy with cholecystectomy. The patient denies fevers and chills. She has not had any diarrhea or other changes in her bowel habits.  Past Medical History  Diagnosis Date  . Biliary dyskinesia   . RUQ pain   . Abdominal pain   . Nausea and vomiting   . History of colon polyps   . Bloating   . Anemia   . Arthritis   . Cancer     cervical , basal cell carcinoma    Past Surgical History  Procedure Laterality Date  . Colonoscopy    . Colonoscopy w/ polypectomy      2 x  . Abdominal hysterectomy    . Nose surgery      basil cell carcinoma removed   . Laser ablation/cauterization of endometrial implants      No current outpatient prescriptions on file.   No current facility-administered medications for this visit.     Allergies  Allergen Reactions  . Penicillins      Family History  Problem Relation Age of Onset  . Cancer Mother     uterus  . Cancer Paternal Aunt     breast     History   Social History  . Marital Status: Divorced    Spouse Name: N/A    Number of Children: N/A  . Years of Education: N/A    Social History Main Topics  . Smoking status: Former Smoker    Quit date: 01/01/2012  . Smokeless tobacco: Never Used  . Alcohol Use: Yes     Comment: occasionally  . Drug Use: No  . Sexual Activity: None    REVIEW OF SYSTEMS - PERTINENT POSITIVES ONLY: 12 point review of systems negative other than HPI and PMH except for abdominal distention  EXAM: Filed Vitals:   12/14/13 1017  BP: 126/78  Pulse: 72  Temp: 98.2 F (36.8 C)  Resp: 14   Filed Weights   12/14/13 1017  Weight: 129 lb 6.1 oz (58.686 kg)     Gen:  No acute distress.  Well nourished and well groomed.   Neurological: Alert and oriented to person, place, and time. Coordination normal.  Head: Normocephalic and atraumatic.  Eyes: Conjunctivae are normal. Pupils are equal, round, and reactive to light. No scleral icterus.  Neck: Normal range of motion. Neck supple. No tracheal deviation or thyromegaly present.  Cardiovascular: Normal rate, regular rhythm, normal heart sounds and intact distal pulses.  Exam reveals no gallop and no friction rub.  No murmur heard. Respiratory: Effort normal.  No respiratory distress. No chest wall tenderness. Breath sounds normal.  No wheezes,  rales or rhonchi.  GI: Soft. Bowel sounds are normal. The abdomen is soft and nondistended.  There is RUQ tenderness.  There is no rebound and no guarding.  Musculoskeletal: Normal range of motion. Extremities are nontender.  Lymphadenopathy: No cervical, preauricular, postauricular or axillary adenopathy is present Skin: Skin is warm and dry. No rash noted. No diaphoresis. No erythema. No pallor. No clubbing, cyanosis, or edema.   Psychiatric: Normal mood and affect. Behavior is normal. Judgment and thought content normal.    LABORATORY RESULTS: Available labs are reviewed    RADIOLOGY RESULTS: See E-Chart or I-Site for most recent results.  Images and reports are reviewed.    ASSESSMENT AND PLAN: Chronic cholecystitis Despite  having a normal ultrasound and a gallbladder ejection fraction of 32%, I do think the patient has chronic cholecystitis based on her tenderness and the replication of symptoms with CCK infusion.  I advised the patient that she would most likely benefit from cholecystectomy. I did state that in patients with atypical symptoms or normal radiologic studies, that there is a small proportion of people that does not improve with cholecystectomy. I also advised her that the bloating and belching may not improve. These frequently improve with cholecystectomy, but these can be related to other causes. Her main target would be to improve her pain and nausea.  The surgical procedure was described to the patient in detail.  The patient was given Agricultural engineer. .  I discussed the incision type and location, the location of the gallbladder, the anatomy of the bile ducts and arteries, and the typical progression of surgery.  I discussed the possibility of converting to an open operation.  I advised of the risks of bleeding, infection, damage to other structures (such as the bile duct, intestine or liver), bile leak, need for other procedures or surgeries, and post op diarrhea/constipation.  We discussed the risk of blood clot.  We discussed the recovery period and post operative restrictions.  The patient was advised against taking blood thinners the week before surgery.      40 min spent in evaluation, examination, and counseling.  >50% of visit spent in counseling.      Maudry Diego MD Surgical Oncology, General and Endocrine Surgery [facility]      Visit Diagnoses: 1. Chronic cholecystitis     Primary Care Physician: Ralene Ok, MD

## 2013-12-29 NOTE — Op Note (Signed)
Laparoscopic Cholecystectomy Procedure Note  Indications: This patient presents with biliary dyskinesia and chronic cholecystitis and will undergo laparoscopic cholecystectomy.  Pre-operative Diagnosis: see above.    Post-operative Diagnosis: Same  Surgeon: Almond Lint   Assistants: Andrey Campanile, ERIC  Anesthesia: General endotracheal anesthesia and local  Procedure Details  The patient was seen again in the Holding Room. The risks, benefits, complications, treatment options, and expected outcomes were discussed with the patient. The possibilities of  bleeding, recurrent infection, damage to nearby structures, the need for additional procedures, failure to diagnose a condition, the possible need to convert to an open procedure, and creating a complication requiring transfusion or operation were discussed with the patient. The likelihood of improving the patient's symptoms with return to their baseline status is good.    The patient and/or family concurred with the proposed plan, giving informed consent. The site of surgery properly noted. The patient was taken to Operating Room, and the procedure verified as Laparoscopic Cholecystectomy.  A Time Out was held and the above information confirmed.  Prior to the induction of general anesthesia, antibiotic prophylaxis was administered. General endotracheal anesthesia was then administered and tolerated well. After the induction, the abdomen was prepped with Chloraprep and draped in the sterile fashion. The patient was positioned in the supine position.  Local anesthetic agent was injected into the skin near the umbilicus and an incision made. We dissected down to the abdominal fascia with blunt dissection.  The fascia was incised vertically and we entered the peritoneal cavity bluntly.  A pursestring suture of 0-Vicryl was placed around the fascial opening.  The Hasson cannula was inserted and secured with the stay suture.  Pneumoperitoneum was then  created with CO2 and tolerated well without any adverse changes in the patient's vital signs. A 5-mm port was placed in the subxiphoid position.  Two 5-mm ports were placed in the right upper quadrant. All skin incisions were infiltrated with a local anesthetic agent before making the incision and placing the trocars.   We positioned the patient in reverse Trendelenburg, tilted slightly to the patient's left.  The gallbladder was identified, the fundus grasped and retracted cephalad. Adhesions were lysed bluntly and with the electrocautery where indicated, taking care not to injure any adjacent organs or viscus. The infundibulum was grasped and retracted laterally, exposing the peritoneum overlying the triangle of Calot. This was then divided and exposed in a blunt fashion. A critical view of the cystic duct and cystic artery was obtained.  The cystic duct was clearly identified and bluntly dissected circumferentially. The cystic duct was ligated with a clip distally and 3 clips proximally.  The cystic artery was identified, dissected free, ligated with clips and divided as well.   The gallbladder was dissected from the liver bed in retrograde fashion with the electrocautery. The gallbladder was removed and placed in an Endocatch bag.  The gallbladder and Endocatch bag were then removed through the umbilical port site.  The liver bed was irrigated and inspected. Hemostasis was achieved with the electrocautery. Copious irrigation was utilized and was repeatedly aspirated until clear.    We again inspected the right upper quadrant for hemostasis.  Pneumoperitoneum was released as we removed the trocars.   The pursestring suture was used to close the umbilical fascia.  4-0 Monocryl was used to close the skin.   The skin was cleaned and dry, and Dermabond was applied. The patient was then extubated and brought to the recovery room in stable condition. Instrument, sponge, and needle  counts were correct at closure  and at the conclusion of the case.   Findings: Some edema.    Estimated Blood Loss: min         Drains: none          Specimens: Gallbladder to pathology       Complications: None; patient tolerated the procedure well.         Disposition: PACU - hemodynamically stable.         Condition: stable

## 2013-12-29 NOTE — Anesthesia Postprocedure Evaluation (Signed)
  Anesthesia Post-op Note  Patient: Brittney Santos  Procedure(s) Performed: Procedure(s) (LRB): LAPAROSCOPIC CHOLECYSTECTOMY (N/A)  Patient Location: PACU  Anesthesia Type: General  Level of Consciousness: awake and alert   Airway and Oxygen Therapy: Patient Spontanous Breathing  Post-op Pain: mild  Post-op Assessment: Post-op Vital signs reviewed, Patient's Cardiovascular Status Stable, Respiratory Function Stable, Patent Airway and No signs of Nausea or vomiting  Last Vitals:  Filed Vitals:   12/29/13 1145  BP:   Pulse: 57  Temp:   Resp: 8    Post-op Vital Signs: stable   Complications: No apparent anesthesia complications

## 2013-12-29 NOTE — Interval H&P Note (Signed)
History and Physical Interval Note:  12/29/2013 9:41 AM  Brittney Santos  has presented today for surgery, with the diagnosis of chronic cholecystitis  The various methods of treatment have been discussed with the patient and family. After consideration of risks, benefits and other options for treatment, the patient has consented to  Procedure(s): LAPAROSCOPIC CHOLECYSTECTOMY POSSIBLE IOC (N/A) as a surgical intervention .  The patient's history has been reviewed, patient examined, no change in status, stable for surgery.  I have reviewed the patient's chart and labs.  Questions were answered to the patient's satisfaction.     [name]

## 2013-12-29 NOTE — Transfer of Care (Signed)
Immediate Anesthesia Transfer of Care Note  Patient: Brittney Santos  Procedure(s) Performed: Procedure(s): LAPAROSCOPIC CHOLECYSTECTOMY (N/A)  Patient Location: PACU  Anesthesia Type:General  Level of Consciousness: awake, sedated and responds to stimulation  Airway & Oxygen Therapy: Patient Spontanous Breathing and Patient connected to face mask oxygen  Post-op Assessment: Report given to PACU RN, Post -op Vital signs reviewed and stable and Patient moving all extremities X 4  Post vital signs: Reviewed and stable  Complications: No apparent anesthesia complications

## 2013-12-30 ENCOUNTER — Encounter (HOSPITAL_COMMUNITY): Payer: Self-pay | Admitting: General Surgery

## 2014-01-19 ENCOUNTER — Encounter (INDEPENDENT_AMBULATORY_CARE_PROVIDER_SITE_OTHER): Payer: Self-pay | Admitting: General Surgery

## 2014-01-19 ENCOUNTER — Ambulatory Visit (INDEPENDENT_AMBULATORY_CARE_PROVIDER_SITE_OTHER): Payer: BC Managed Care – PPO | Admitting: General Surgery

## 2014-01-19 VITALS — BP 126/62 | HR 80 | Temp 98.0°F | Resp 18 | Ht 65.0 in | Wt 128.0 lb

## 2014-01-19 DIAGNOSIS — K811 Chronic cholecystitis: Secondary | ICD-10-CM

## 2014-01-19 NOTE — Assessment & Plan Note (Signed)
No evidence of surgical complications.  Follow up as needed.   

## 2014-01-19 NOTE — Progress Notes (Signed)
HISTORY: Patient is around 3 weeks status post laparoscopic cholecystectomy.  She is doing well overall. She does still have a sensation of tightness when she sits for a long period of time. She denies diarrhea, fevers, chills, or jaundice. She is not having nausea or vomiting.    EXAM: General:  Alert and oriented x3. Incision:  No evidence of cellulitis at the incisions. No evidence of hernia. Healing well.   PATHOLOGY: Chronic cholecystitis   ASSESSMENT AND PLAN:   Chronic cholecystitis No evidence of surgical complications.  Followup as needed.      Maudry DiegoFaera L Grissel Tyrell, MD Surgical Oncology, General & Endocrine Surgery [facility]  Ralene OkMOREIRA,ROY, MD Ralene OkMoreira, Roy, MD

## 2014-04-14 ENCOUNTER — Encounter (INDEPENDENT_AMBULATORY_CARE_PROVIDER_SITE_OTHER): Payer: Self-pay | Admitting: General Surgery

## 2014-04-14 ENCOUNTER — Ambulatory Visit (INDEPENDENT_AMBULATORY_CARE_PROVIDER_SITE_OTHER): Payer: BC Managed Care – PPO | Admitting: General Surgery

## 2014-04-14 VITALS — BP 118/80 | HR 76 | Temp 97.5°F | Resp 16 | Ht 66.0 in | Wt 126.8 lb

## 2014-04-14 DIAGNOSIS — A630 Anogenital (venereal) warts: Secondary | ICD-10-CM | POA: Insufficient documentation

## 2014-04-14 NOTE — Patient Instructions (Signed)
Anal Warts  What are anal warts? Anal warts (also called "condyloma acuminata") are a condition that affects the area around and inside the anus. They may also affect the skin of the genital area. They first appear as tiny spots or growths, perhaps as small as the head of a pin, and may grow quite large and cover the entire anal area. Usually, they do not cause pain or discomfort to afflicted individuals and patients may be unaware that the warts are present. Some patients will experience symptoms, such as itching, bleeding, mucus discharge and/or a feeling of a lump or mass in the anal area.  What causes anal warts? They are caused by the human papilloma virus (HPV), which is transmitted from person to person by direct contact. HPV is considered a sexually transmitted disease (STD). You do not have to have anal intercourse to develop anal warts. Do anal warts always need to be removed? Yes. If they are not removed, the warts usually grow larger and multiply. Left untreated, the warts may lead to an increased risk of cancer in the affected area. What treatments are available? If warts are very small and are located only on the skin around the anus, they may be treated with a topical medication. They may also be treated by freezing the warts with liquid nitrogen or removed surgically. Surgery typically involves cutting or burning the warts off. While this provides immediate results, it must be performed using either a local anesthetic - such as novocaine - or a general or spinal anesthetic, depending on the number and exact location of warts being treated. It is important that an internal anal examination with an instrument called an anoscope be done by your treating physician to ensure you do not have any inside the anal canal (internal anal warts). Internal anal warts may not be as suitable for treatment by topical medications, and may need to be treated surgically. Additionally, your physician may wish to  examine the entire pelvic region to include the vaginal or penile area to look for other warts that may require treatment. Must I be hospitalized for surgical treatment? Surgical treatment of anal warts is usually performed as outpatient surgery. How much time will I lose from work after surgical treatment? Most people are moderately uncomfortable for a few days after treatment and pain medication may be prescribed. Depending on the extent of the disease, some people return to work the next day, while others may remain out of work for several days to weeks. Will a single treatment cure the problem? When warts are extensive, your surgeon may wish to perform the surgery in stages. Additionally, recurrent warts are common. The virus that causes the warts can live concealed in tissues that appear normal for several months before another wart develops. As new warts develop, they usually can be treated in the physician's office. Sometimes new warts develop so rapidly that office treatment would be quite uncomfortable. In these situations, a second and, occasionally, third outpatient surgical visit may be recommended. How long is treatment usually continued? Follow-up visits are necessary at frequent intervals for several months after all warts appear to be gone, to be certain that no new warts occur. What can be done to avoid getting these warts again? In some cases, warts may recur repeatedly after successful removal, since the virus that causes the warts often persists in a dormant state in body tissues. Discuss with your physician how often you should be evaluated for recurrent warts. Abstain from sexual   contact with individuals who have anal (or genital) warts. Since many individuals may be unaware that they suffer from this condition, sexual abstinence, condom protection or limiting sexual contact to single partner will reduce your potential exposure to the contagious virus that causes these warts. As a  precaution, sexual partners ought to be checked for warts and other sexual transmitted diseases, even if they have no symptoms. What is a colon and rectal surgeon? Colon and rectal surgeons are experts in the surgical and non-surgical treatment of diseases of the colon, rectum and anus. They have completed advanced surgical training in the treatment of these diseases as well as full general surgical training. Board-certified colon and rectal surgeons complete residencies in general surgery and colon and rectal surgery, and pass intensive examinations conducted by the American Board of Surgery and the American Board of Colon and Rectal Surgery. They are well-versed in the treatment of both benign and malignant diseases of the colon, rectum and anus and are able to perform routine screening examinations and surgically treat conditions if indicated to do so. author: Jennifer Lowney, MD, FASCRS, on behalf of the ASCRS Public Relations Committee  2012 American Society of Colon & Rectal Surgeons   

## 2014-04-14 NOTE — Progress Notes (Signed)
Chief Complaint  Patient presents with  . eval p/a warts    HISTORY: Brittney Santos is a 58 y.o. female who presents to the office with perianal lesions.  This had been occurring for several months.  She has tried a hemorrhoid cream in the past with no success.   Her bowel habits are irregular and her bowel movements are loose after her cholecystectomy.  Her fiber intake is dietary.  Her last colonoscopy was Nov 2014 and 5 polyps and is due again in 3 years.     Past Medical History  Diagnosis Date  . Biliary dyskinesia   . RUQ pain   . Abdominal pain   . Nausea and vomiting   . History of colon polyps   . Bloating   . Arthritis   . Cancer     cervical , basal cell carcinoma  . Anemia   . History of kidney stones   . Dizziness     occasional      Past Surgical History  Procedure Laterality Date  . Colonoscopy    . Colonoscopy w/ polypectomy      2 x  . Nose surgery      basil cell carcinoma removed m moes procedure  . Laser ablation/cauterization of endometrial implants  yrs ago  . Abdominal hysterectomy  1999    both ovaries removed  . Lithotripsy      x 1  . Cholecystectomy N/A 12/29/2013    Procedure: LAPAROSCOPIC CHOLECYSTECTOMY;  Surgeon: Almond LintFaera Byerly, MD;  Location: [facility];  Service: General;  Laterality: N/A;        Current Outpatient Prescriptions  Medication Sig Dispense Refill  . acetaminophen (TYLENOL) 500 MG tablet Take 500 mg by mouth every 6 (six) hours as needed for mild pain or moderate pain.       No current facility-administered medications for this visit.      Allergies  Allergen Reactions  . Penicillins Itching      Family History  Problem Relation Age of Onset  . Cancer Mother     uterus  . Cancer Paternal Aunt     breast    History   Social History  . Marital Status: Divorced    Spouse Name: N/A    Number of Children: N/A  . Years of Education: N/A   Social History Main Topics  . Smoking status: Former Smoker -- 1.00  packs/day for 38 years    Types: Cigarettes    Quit date: 01/01/2012  . Smokeless tobacco: Never Used  . Alcohol Use: Yes     Comment: occasionally  . Drug Use: No  . Sexual Activity: None   Other Topics Concern  . None   Social History Narrative  . None      REVIEW OF SYSTEMS - PERTINENT POSITIVES ONLY: Review of Systems - General ROS: negative for - chills, fever or weight loss Hematological and Lymphatic ROS: negative for - bleeding problems, blood clots or bruising Respiratory ROS: no cough, shortness of breath, or wheezing Cardiovascular ROS: no chest pain or dyspnea on exertion Gastrointestinal ROS: no abdominal pain, change in bowel habits, or black or bloody stools Genito-Urinary ROS: no dysuria, trouble voiding, or hematuria  EXAM: Filed Vitals:   04/14/14 0841  BP: 118/80  Pulse: 76  Temp: 97.5 F (36.4 C)  Resp: 16    General appearance: alert and cooperative Resp: clear to auscultation bilaterally Cardio: regular rate and rhythm GI: normal findings: soft, non-tender   Procedure:  Anoscopy Surgeon: Maisie Fushomas Diagnosis: anal warts  Assistant: Marcelino DusterMichelle After the risks and benefits were explained, verbal consent was obtained for above procedure  Anesthesia: none Findings: 2 small posterior perianal condyloma at midline. Larger perianal condyloma on anterior skin tag extending into anterior anal canal. No other internal anal lesions noted.    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  I recommended that she undergo laser ablation in the operating room. I think this will be the best way to get rid of all of her condyloma one time. We discussed the management of anal warts. We discussed chemical destruction, immunotherapy, and surgical excision. I discussed the pros and cons of each approach. We discussed the risk and benefits and the expected outcome with chemical destruction with agents such as podophyllin. I explained that  podophyllin is generally not been effective and has a high recurrence rate. We discussed the use of Aldara ointment. I explained that it has a 30-70% chance at resolving or at least reducing the number of anal warts. I explained that it is applied 3 times a week at night and left on overnight. I explained that skin irritation is the most common side effect. We then discussed surgical excision specifically excision and fulguration. I explained how the surgery is performed. I explained that it can be painful however it generally has the highest success rate. We discussed the risk and benefits of surgery including but not limited to bleeding, infection, injury to surrounding structures, need to do a formal anoscopic exam to evaluate for anal canal warts, urinary retention, wart recurrence, and general anesthesia risk. We discussed the typical aftercare.     Vanita PandaAlicia C Archita Lomeli, MD Colon and Rectal Surgery / General Surgery [facility]      Visit Diagnoses: No diagnosis found.  Primary Care Physician: Ralene OkMOREIRA,ROY, MD

## 2014-06-09 ENCOUNTER — Encounter (HOSPITAL_BASED_OUTPATIENT_CLINIC_OR_DEPARTMENT_OTHER): Payer: Self-pay | Admitting: *Deleted

## 2014-06-10 ENCOUNTER — Encounter (HOSPITAL_BASED_OUTPATIENT_CLINIC_OR_DEPARTMENT_OTHER): Payer: Self-pay | Admitting: *Deleted

## 2014-06-10 NOTE — Progress Notes (Signed)
NPO AFTER MN.  ARRIVE AT 1000.  NEEDS HG.  

## 2014-06-16 ENCOUNTER — Encounter (HOSPITAL_BASED_OUTPATIENT_CLINIC_OR_DEPARTMENT_OTHER)
Admission: RE | Disposition: A | Discharge status: Home / Self Care | Payer: Self-pay | Source: Ambulatory Visit | Attending: General Surgery

## 2014-06-16 ENCOUNTER — Encounter (HOSPITAL_BASED_OUTPATIENT_CLINIC_OR_DEPARTMENT_OTHER): Payer: BC Managed Care – PPO | Admitting: Anesthesiology

## 2014-06-16 ENCOUNTER — Ambulatory Visit (HOSPITAL_BASED_OUTPATIENT_CLINIC_OR_DEPARTMENT_OTHER): Payer: BC Managed Care – PPO | Admitting: Anesthesiology

## 2014-06-16 ENCOUNTER — Encounter (HOSPITAL_BASED_OUTPATIENT_CLINIC_OR_DEPARTMENT_OTHER): Payer: Self-pay | Admitting: Anesthesiology

## 2014-06-16 ENCOUNTER — Ambulatory Visit (HOSPITAL_BASED_OUTPATIENT_CLINIC_OR_DEPARTMENT_OTHER)
Admission: RE | Admit: 2014-06-16 | Discharge: 2014-06-16 | Disposition: A | Discharge status: Home / Self Care | Payer: BC Managed Care – PPO | Source: Ambulatory Visit | Attending: General Surgery | Admitting: General Surgery

## 2014-06-16 DIAGNOSIS — A630 Anogenital (venereal) warts: Secondary | ICD-10-CM

## 2014-06-16 DIAGNOSIS — Z87891 Personal history of nicotine dependence: Secondary | ICD-10-CM | POA: Insufficient documentation

## 2014-06-16 DIAGNOSIS — Z8541 Personal history of malignant neoplasm of cervix uteri: Secondary | ICD-10-CM | POA: Insufficient documentation

## 2014-06-16 DIAGNOSIS — K219 Gastro-esophageal reflux disease without esophagitis: Secondary | ICD-10-CM | POA: Insufficient documentation

## 2014-06-16 DIAGNOSIS — Z79899 Other long term (current) drug therapy: Secondary | ICD-10-CM | POA: Insufficient documentation

## 2014-06-16 DIAGNOSIS — K6282 Dysplasia of anus: Secondary | ICD-10-CM

## 2014-06-16 DIAGNOSIS — Z85828 Personal history of other malignant neoplasm of skin: Secondary | ICD-10-CM | POA: Insufficient documentation

## 2014-06-16 HISTORY — DX: Calculus of kidney: N20.0

## 2014-06-16 HISTORY — DX: Personal history of other malignant neoplasm of skin: Z85.828

## 2014-06-16 HISTORY — DX: Noninfective gastroenteritis and colitis, unspecified: K52.9

## 2014-06-16 HISTORY — PX: LASER ABLATION CONDOLAMATA: SHX5941

## 2014-06-16 HISTORY — DX: Gastro-esophageal reflux disease without esophagitis: K21.9

## 2014-06-16 HISTORY — DX: Anogenital (venereal) warts: A63.0

## 2014-06-16 HISTORY — DX: Personal history of malignant neoplasm of cervix uteri: Z85.41

## 2014-06-16 HISTORY — DX: Personal history of other malignant neoplasm of skin: Z98.890

## 2014-06-16 LAB — POCT HEMOGLOBIN-HEMACUE: Hemoglobin: 12.1 g/dL (ref 12.0–15.0)

## 2014-06-16 SURGERY — ABLATION, CONDYLOMA, USING LASER
Anesthesia: Monitor Anesthesia Care | Site: Anus

## 2014-06-16 MED ORDER — OXYCODONE HCL 5 MG PO TABS: 5.0000 mg | ORAL_TABLET | Freq: Four times a day (QID) | ORAL | Status: DC | PRN

## 2014-06-16 MED ORDER — LACTATED RINGERS IV SOLN
INTRAVENOUS | Status: DC
  Administered 2014-06-16 (×2): via INTRAVENOUS
  Filled 2014-06-16: qty 1000

## 2014-06-16 MED ORDER — MIDAZOLAM HCL 2 MG/2ML IJ SOLN
INTRAMUSCULAR | Status: AC
  Filled 2014-06-16: qty 2

## 2014-06-16 MED ORDER — BUPIVACAINE-EPINEPHRINE 0.5% -1:200000 IJ SOLN
INTRAMUSCULAR | Status: DC | PRN
Start: 2014-06-16 — End: 2014-06-16
  Administered 2014-06-16: 20 mL

## 2014-06-16 MED ORDER — LIDOCAINE HCL (CARDIAC) 20 MG/ML IV SOLN
INTRAVENOUS | Status: DC | PRN
  Administered 2014-06-16: 50 mg via INTRAVENOUS

## 2014-06-16 MED ORDER — PROPOFOL 10 MG/ML IV EMUL
INTRAVENOUS | Status: DC | PRN
  Administered 2014-06-16: 75 ug/kg/min via INTRAVENOUS

## 2014-06-16 MED ORDER — ONDANSETRON HCL 4 MG/2ML IJ SOLN
INTRAMUSCULAR | Status: DC | PRN
  Administered 2014-06-16: 4 mg via INTRAVENOUS

## 2014-06-16 MED ORDER — LIDOCAINE 5 % EX OINT
TOPICAL_OINTMENT | CUTANEOUS | Status: DC | PRN
  Administered 2014-06-16: 1

## 2014-06-16 MED ORDER — DEXAMETHASONE SODIUM PHOSPHATE 4 MG/ML IJ SOLN
INTRAMUSCULAR | Status: DC | PRN
  Administered 2014-06-16: 8 mg via INTRAVENOUS

## 2014-06-16 MED ORDER — STERILE WATER FOR IRRIGATION IR SOLN
Status: DC | PRN
  Administered 2014-06-16: 1000 mL

## 2014-06-16 MED ORDER — PROPOFOL 10 MG/ML IV BOLUS
INTRAVENOUS | Status: DC | PRN
  Administered 2014-06-16: 50 mg via INTRAVENOUS

## 2014-06-16 MED ORDER — MIDAZOLAM HCL 5 MG/5ML IJ SOLN
INTRAMUSCULAR | Status: DC | PRN
  Administered 2014-06-16: 2 mg via INTRAVENOUS

## 2014-06-16 MED ORDER — FENTANYL CITRATE 0.05 MG/ML IJ SOLN
INTRAMUSCULAR | Status: DC | PRN
  Administered 2014-06-16 (×4): 25 ug via INTRAVENOUS

## 2014-06-16 MED ORDER — DOCUSATE SODIUM 100 MG PO CAPS: 100.0000 mg | ORAL_CAPSULE | Freq: Two times a day (BID) | ORAL | Status: DC

## 2014-06-16 MED ORDER — FENTANYL CITRATE 0.05 MG/ML IJ SOLN
INTRAMUSCULAR | Status: AC
  Filled 2014-06-16: qty 4

## 2014-06-16 SURGICAL SUPPLY — 34 items
BLADE HEX COATED 2.75 (ELECTRODE) ×3 IMPLANT
BLADE SURG 15 STRL LF DISP TIS (BLADE) ×1 IMPLANT
BLADE SURG 15 STRL SS (BLADE) ×2
BRIEF STRETCH FOR OB PAD LRG (UNDERPADS AND DIAPERS) ×3 IMPLANT
CANISTER SUCTION 2500CC (MISCELLANEOUS) ×3 IMPLANT
COVER MAYO STAND STRL (DRAPES) ×3 IMPLANT
COVER TABLE BACK 60X90 (DRAPES) ×3 IMPLANT
DRAPE PED LAPAROTOMY (DRAPES) ×3 IMPLANT
DRSG PAD ABDOMINAL 8X10 ST (GAUZE/BANDAGES/DRESSINGS) ×3 IMPLANT
ELECT REM PT RETURN 9FT ADLT (ELECTROSURGICAL) ×3
ELECTRODE REM PT RTRN 9FT ADLT (ELECTROSURGICAL) ×1 IMPLANT
GAUZE VASELINE 3X9 (GAUZE/BANDAGES/DRESSINGS) IMPLANT
GLOVE BIO SURGEON STRL SZ 6 (GLOVE) ×3 IMPLANT
GLOVE BIO SURGEON STRL SZ 6.5 (GLOVE) ×2 IMPLANT
GLOVE BIO SURGEONS STRL SZ 6.5 (GLOVE) ×1
GLOVE BIOGEL PI IND STRL 7.0 (GLOVE) ×1 IMPLANT
GLOVE BIOGEL PI INDICATOR 7.0 (GLOVE) ×2
GLOVE INDICATOR 6.5 STRL GRN (GLOVE) ×6 IMPLANT
GOWN STRL REUS W/TWL 2XL LVL3 (GOWN DISPOSABLE) ×3 IMPLANT
GOWN STRL REUS W/TWL LRG LVL3 (GOWN DISPOSABLE) ×3 IMPLANT
NEEDLE HYPO 25X1 1.5 SAFETY (NEEDLE) ×3 IMPLANT
PACK BASIN DAY SURGERY FS (CUSTOM PROCEDURE TRAY) ×3 IMPLANT
PAD ABD 8X10 STRL (GAUZE/BANDAGES/DRESSINGS) ×3 IMPLANT
PENCIL BUTTON HOLSTER BLD 10FT (ELECTRODE) ×3 IMPLANT
SPONGE GAUZE 4X4 12PLY STER LF (GAUZE/BANDAGES/DRESSINGS) ×3 IMPLANT
SUT CHROMIC 3 0 SH 27 (SUTURE) ×3 IMPLANT
SYR CONTROL 10ML LL (SYRINGE) ×3 IMPLANT
TOWEL OR 17X24 6PK STRL BLUE (TOWEL DISPOSABLE) ×6 IMPLANT
TRAY DSU PREP LF (CUSTOM PROCEDURE TRAY) ×3 IMPLANT
TUBE CONNECTING 12'X1/4 (SUCTIONS) ×1
TUBE CONNECTING 12X1/4 (SUCTIONS) ×2 IMPLANT
VACUUM HOSE 7/8X10 W/ WAND (MISCELLANEOUS) ×3 IMPLANT
WATER STERILE IRR 500ML POUR (IV SOLUTION) ×6 IMPLANT
YANKAUER SUCT BULB TIP NO VENT (SUCTIONS) ×3 IMPLANT

## 2014-06-16 NOTE — Transfer of Care (Signed)
Immediate Anesthesia Transfer of Care Note  Patient: Brittney Santos  Procedure(s) Performed: Procedure(s) (LRB): ANAL EXAM UNDER ANESTHESIA,  LASER ABLATION OF ANAL CONDOLAMATA (N/A)  Patient Location: PACU  Anesthesia Type:MAC  Level of Consciousness: awake, alert  and oriented  Airway & Oxygen Therapy: Patient Spontanous Breathing and Patient connected tonasal cannula oxygen  Post-op Assessment: Report given to PACU RN and Post -op Vital signs reviewed and stable  Post vital signs: Reviewed and stable  Complications: No apparent anesthesia complications

## 2014-06-16 NOTE — Anesthesia Preprocedure Evaluation (Signed)
Anesthesia Evaluation  Patient identified by MRN, date of birth, ID band Patient awake    Reviewed: Allergy & Precautions, H&P , NPO status , Patient's Chart, lab work & pertinent test results  Airway Mallampati: II TM Distance: >3 FB Neck ROM: Full    Dental  (+) Edentulous Upper, Partial Lower, Dental Advisory Given   Pulmonary former smoker,  breath sounds clear to auscultation  Pulmonary exam normal       Cardiovascular negative cardio ROS  Rhythm:Regular Rate:Normal     Neuro/Psych negative neurological ROS  negative psych ROS   GI/Hepatic Neg liver ROS, GERD-  ,  Endo/Other  negative endocrine ROS  Renal/GU Renal disease  negative genitourinary   Musculoskeletal negative musculoskeletal ROS (+)   Abdominal   Peds  Hematology negative hematology ROS (+)   Anesthesia Other Findings   Reproductive/Obstetrics                           Anesthesia Physical Anesthesia Plan  ASA: II  Anesthesia Plan: MAC   Post-op Pain Management:    Induction: Intravenous  Airway Management Planned: Simple Face Mask  Additional Equipment:   Intra-op Plan:   Post-operative Plan:   Informed Consent: I have reviewed the patients History and Physical, chart, labs and discussed the procedure including the risks, benefits and alternatives for the proposed anesthesia with the patient or authorized representative who has indicated his/her understanding and acceptance.   Dental advisory given  Plan Discussed with: CRNA  Anesthesia Plan Comments:         Anesthesia Quick Evaluation

## 2014-06-16 NOTE — Anesthesia Procedure Notes (Signed)
Procedure Name: MAC Date/Time: 06/16/2014 10:18 AM Performed by: Norva PavlovALLAWAY, ROBIN G Pre-anesthesia Checklist: Patient identified, Emergency Drugs available, Suction available, Patient being monitored and Timeout performed Patient Re-evaluated:Patient Re-evaluated prior to inductionOxygen Delivery Method: Nasal cannula Intubation Type: IV induction Placement Confirmation: positive ETCO2

## 2014-06-16 NOTE — H&P (Signed)
Chief Complaint   Patient presents with   .  eval p/a warts   HISTORY: Brittney Santos is a 58 y.o. female who presents to the office with perianal lesions. This had been occurring for several months. She has tried a hemorrhoid cream in the past with no success. Her bowel habits are irregular and her bowel movements are loose after her cholecystectomy. Her fiber intake is dietary. Her last colonoscopy was Nov 2014 and 5 polyps and is due again in 3 years.  Past Medical History   Diagnosis  Date   .  Biliary dyskinesia    .  RUQ pain    .  Abdominal pain    .  Nausea and vomiting    .  History of colon polyps    .  Bloating    .  Arthritis    .  Cancer      cervical , basal cell carcinoma   .  Anemia    .  History of kidney stones    .  Dizziness      occasional    Past Surgical History   Procedure  Laterality  Date   .  Colonoscopy     .  Colonoscopy w/ polypectomy       2 x   .  Nose surgery       basil cell carcinoma removed m moes procedure   .  Laser ablation/cauterization of endometrial implants   yrs ago   .  Abdominal hysterectomy   1999     both ovaries removed   .  Lithotripsy       x 1   .  Cholecystectomy  N/A  12/29/2013     Procedure: LAPAROSCOPIC CHOLECYSTECTOMY; Surgeon: Almond LintFaera Byerly, MD; Location: [facility]; Service: General; Laterality: N/A;    Current Outpatient Prescriptions   Medication  Sig  Dispense  Refill   .  acetaminophen (TYLENOL) 500 MG tablet  Take 500 mg by mouth every 6 (six) hours as needed for mild pain or moderate pain.      No current facility-administered medications for this visit.    Allergies   Allergen  Reactions   .  Penicillins  Itching    Family History   Problem  Relation  Age of Onset   .  Cancer  Mother      uterus   .  Cancer  Paternal Aunt      breast    History    Social History   .  Marital Status:  Divorced     Spouse Name:  N/A     Number of Children:  N/A   .  Years of Education:  N/A    Social History  Main Topics   .  Smoking status:  Former Smoker -- 1.00 packs/day for 38 years     Types:  Cigarettes     Quit date:  01/01/2012   .  Smokeless tobacco:  Never Used   .  Alcohol Use:  Yes      Comment: occasionally   .  Drug Use:  No   .  Sexual Activity:  None    Other Topics  Concern   .  None    Social History Narrative   .  None   REVIEW OF SYSTEMS - PERTINENT POSITIVES ONLY:  Review of Systems - General ROS: negative for - chills, fever or weight loss  Hematological and Lymphatic ROS: negative for - bleeding problems, blood clots  or bruising  Respiratory ROS: no cough, shortness of breath, or wheezing  Cardiovascular ROS: no chest pain or dyspnea on exertion  Gastrointestinal ROS: no abdominal pain, change in bowel habits, or black or bloody stools  Genito-Urinary ROS: no dysuria, trouble voiding, or hematuria  EXAM:  Filed Vitals:   06/16/14 0944  BP: 109/70  Pulse: 67  Temp: 97.4 F (36.3 C)  Resp: 14   General appearance: alert and cooperative  Resp: clear to auscultation bilaterally  Cardio: regular rate and rhythm  GI: normal findings: soft, non-tender  Procedure: Anoscopy  Surgeon: Maisie Fushomas  Diagnosis: anal warts  Assistant: Marcelino DusterMichelle  After the risks and benefits were explained, verbal consent was obtained for above procedure  Anesthesia: none  Findings: 2 Skye Rodarte posterior perianal condyloma at midline. Larger perianal condyloma on anterior skin tag extending into anterior anal canal. No other internal anal lesions noted.  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 I recommended that she undergo laser ablation in the operating room. I think this will be the best way to get rid of all of her condyloma one time.  We discussed the management of anal warts. We discussed chemical destruction, immunotherapy, and surgical excision. I discussed the pros and cons of each approach. We discussed the risk and benefits and the expected outcome  with chemical destruction with agents such as podophyllin. I explained that podophyllin is generally not been effective and has a high recurrence rate. We discussed the use of Aldara ointment. I explained that it has a 30-70% chance at resolving or at least reducing the number of anal warts. I explained that it is applied 3 times a week at night and left on overnight. I explained that skin irritation is the most common side effect. We then discussed surgical excision specifically excision and fulguration. I explained how the surgery is performed. I explained that it can be painful however it generally has the highest success rate. We discussed the risk and benefits of surgery including but not limited to bleeding, infection, injury to surrounding structures, need to do a formal anoscopic exam to evaluate for anal canal warts, urinary retention, wart recurrence, and general anesthesia risk. We discussed the typical aftercare.  Vanita PandaAlicia C Thomas, MD  Colon and Rectal Surgery / General Surgery  [facility]

## 2014-06-16 NOTE — Op Note (Addendum)
06/16/2014  10:54 AM  PATIENT:  Brittney Santos  58 y.o. female  Patient Care Team: Ralene Okoy Moreira, MD as PCP - General (Internal Medicine)  PRE-OPERATIVE DIAGNOSIS:  anal condolamata  POST-OPERATIVE DIAGNOSIS:  anal condolamata  PROCEDURE:  ANAL EXAM UNDER ANESTHESIA,  Excision of anal condyloma, biopsy of anal canal, LASER ABLATION OF ANAL CONDOLAMATA  SURGEON:  Surgeon(s): Romie LeveeAlicia Thomas, MD  ASSISTANT: none   ANESTHESIA:   local and MAC  EBL: 10ml     SPECIMEN:  Source of Specimen:  L perianal lesion and posterior anal lesion  DISPOSITION OF SPECIMEN:  PATHOLOGY  COUNTS:  YES  PLAN OF CARE: Discharge to home after PACU  PATIENT DISPOSITION:  PACU - hemodynamically stable.  INDICATION: This is a 58 y.o. F with perianal condyloma.    OR FINDINGS: Large perianal condyloma, 3 smaller lesions at posterior midline and aceto-white lesion noted internally at posterior midline  DESCRIPTION: The patient was identified in the preoperative holding area and taken to the OR where they were laid prone on the operating room table in jackknife position. MAC anesthesia was smoothly induced.  The patient was then prepped and draped in the usual sterile fashion. A surgical timeout was performed indicating the correct patient, procedure, positioning and preoperative antibioitics. SCDs were noted to be in place and functioning prior to the operation.   After this was completed, a sponge was soaked in 5% acetic acid was placed over the perianal region. This was allowed to soak for 2 minutes. The sponge was removed and the perianal region was evaluated.  There were several obvious lesions noted.  The larger perianal lesion was excised sharply and sent to pathology.  Hemostasis was achieved with cautery.  The internal anal canal was evaluated via anoscopy with a Hill-Ferguson anoscope.  There was one area that stained aceto-white at the posterior midline.  This was partially removed with scissors and  sent as a biopsy specimen.  After this was completed, hemostasis was achieved with electrocautery and the external biopsy site was closed using a 3-0 chromic suture.  Next the laser was brought onto the field.  The edges of the operative field were draped with wet towels.  Appropriate ventilation was obtained.  All staff were protected with small particle masks and goggles safe for the laser.  The laser was then activated. All condylomatous lesions were ablated. Hemostasis was then achieved using electrocautery. A thin layer of lidocaine ointment was then placed over the lesions. A sterile dressing was applied over this. The patient was then awakened from anesthesia and sent to the postanesthesia care unit in stable condition. All counts were correct operating room staff.

## 2014-06-16 NOTE — Discharge Instructions (Addendum)
ANORECTAL SURGERY: POST OP INSTRUCTIONS °1. Take your usually prescribed home medications unless otherwise directed. °2. DIET: During the first few hours after surgery sip on some liquids until you are able to urinate.  It is normal to not urinate for several hours after this surgery.  If you feel uncomfortable, please contact the office for instructions.  After you are able to urinate,you may eat, if you feel like it.  Follow a light bland diet the first 24 hours after arrival home, such as soup, liquids, crackers, etc.  Be sure to include lots of fluids daily (6-8 glasses).  Avoid fast food or heavy meals, as your are more likely to get nauseated.  Eat a low fat diet the next few days after surgery.  Limit caffeine intake to 1-2 servings a day. °3. PAIN CONTROL: °a. Pain is best controlled by a usual combination of several different methods TOGETHER: °i. Muscle relaxation °1.  Soak in a warm bath (or Sitz bath) three times a day and after bowel movements.  Continue to do this until all pain is resolved. °ii. Over the counter pain medication °iii. Prescription pain medication °b. Most patients will experience some swelling and discomfort in the anus/rectal area and incisions.  Heat such as warm towels, sitz baths, warm baths, etc to help relax tight/sore spots and speed recovery.  Some people prefer to use ice, especially in the first couple days after surgery, as it may decrease the pain and swelling, or alternate between ice & heat.  Experiment to what works for you.  Swelling and bruising can take several weeks to resolve.  Pain can take even longer to completely resolve. °c. It is helpful to take an over-the-counter pain medication regularly for the first few weeks.  Choose one of the following that works best for you: °i. Naproxen (Aleve, etc)  Two 220mg tabs twice a day °ii. Ibuprofen (Advil, etc) Three 200mg tabs four times a day (every meal & bedtime) °d. A  prescription for pain medication (such as  percocet, oxycodone, hydrocodone, etc) should be given to you upon discharge.  Take your pain medication as prescribed.  °i. If you are having problems/concerns with the prescription medicine (does not control pain, nausea, vomiting, rash, itching, etc), please call us [phone] to see if we need to switch you to a different pain medicine that will work better for you and/or control your side effect better. °ii. If you need a refill on your pain medication, please contact your pharmacy.  They will contact our office to request authorization. Prescriptions will not be filled after 5 pm or on week-ends. °4. KEEP YOUR BOWELS REGULAR and AVOID CONSTIPATION °a. The goal is one to two soft bowel movements a day.  You should at least have a bowel movement every other day. °b. Avoid getting constipated.  Between the surgery and the pain medications, it is common to experience some constipation. This can be very painful after rectal surgery.  Increasing fluid intake and taking a fiber supplement (such as Metamucil, Citrucel, FiberCon, etc) 1-2 times a day regularly will usually help prevent this problem from occurring.  A stool softener like colace is also recommended.  This can be purchased over the counter at your pharmacy.  You can take it up to 3 times a day.  If you do not have a bowel movement after 24 hrs since your surgery, take one does of milk of magnesia.  If you still haven't had a bowel movement 8-12   hours after that dose, take another dose.  If you don't have a bowel movement 48 hrs after surgery, purchase a Fleets enema from the drug store and administer gently per package instructions.  If you still are having trouble with your bowel movements after that, please call the office for further instructions. °c. If you develop diarrhea or have many loose bowel movements, simplify your diet to bland foods & liquids for a few days.  Stop any stool softeners and decrease your fiber supplement.  Switching to mild  anti-diarrheal medications (Kayopectate, Pepto Bismol) can help.  If this worsens or does not improve, please call us. ° °5. Wound Care °a. Remove your bandages before your first bowel movement or 8 hours after surgery.     °b. Remove any wound packing material at this tim,e as well.  You do not need to repack the wound unless instructed otherwise.  Wear an absorbent pad or soft cotton gauze in your underwear to catch any drainage and help keep the area clean. You should change this every 2-3 hours while awake. °c. Keep the area clean and dry.  Bathe / shower every day, especially after bowel movements.  Keep the area clean by showering / bathing over the incision / wound.   It is okay to soak an open wound to help wash it.  Wet wipes or showers / gentle washing after bowel movements is often less traumatic than regular toilet paper. °d. You may have some styrofoam-like soft packing in the rectum which will come out with the first bowel movement.  °e. You will often notice bleeding with bowel movements.  This should slow down by the end of the first week of surgery °f. Expect some drainage.  This should slow down, too, by the end of the first week of surgery.  Wear an absorbent pad or soft cotton gauze in your underwear until the drainage stops. °g. Do Not sit on a rubber or pillow ring.  This can make you symptoms worse.  You may sit on a soft pillow if needed.  °6. ACTIVITIES as tolerated:   °a. You may resume regular (light) daily activities beginning the next day--such as daily self-care, walking, climbing stairs--gradually increasing activities as tolerated.  If you can walk 30 minutes without difficulty, it is safe to try more intense activity such as jogging, treadmill, bicycling, low-impact aerobics, swimming, etc. °b. Save the most intensive and strenuous activity for last such as sit-ups, heavy lifting, contact sports, etc  Refrain from any heavy lifting or straining until you are off narcotics for pain  control.   °c. You may drive when you are no longer taking prescription pain medication, you can comfortably sit for long periods of time, and you can safely maneuver your car and apply brakes. °d. You may have sexual intercourse when it is comfortable.  °7. FOLLOW UP in our office °a. Please call [facility] at [phone] to set up an appointment to see your surgeon in the office for a follow-up appointment approximately 3-4 weeks after your surgery. °b. Make sure that you call for this appointment the day you arrive home to insure a convenient appointment time. °10. IF YOU HAVE DISABILITY OR FAMILY LEAVE FORMS, BRING THEM TO THE OFFICE FOR PROCESSING.  DO NOT GIVE THEM TO YOUR DOCTOR. ° ° ° ° °WHEN TO CALL US [phone]: °1. Poor pain control °2. Reactions / problems with new medications (rash/itching, nausea, etc)  °3. Fever over 101.5 F (38.5   C) °4. Inability to urinate °5. Nausea and/or vomiting °6. Worsening swelling or bruising °7. Continued bleeding from incision. °8. Increased pain, redness, or drainage from the incision ° °The clinic staff is available to answer your questions during regular business hours (8:30am-5pm).  Please don’t hesitate to call and ask to speak to one of our nurses for clinical concerns.   A surgeon from [facility] is always on call at the hospitals °  °If you have a medical emergency, go to the nearest emergency room or call 911. °  ° °[facility] °[address], [address], [place],   [address] ? °MAIN: [phone] ? TOLL FREE: [phone] ? °FAX [phone] °[email or web address] ° ° ° °Post Anesthesia Home Care Instructions ° °Activity: °Get plenty of rest for the remainder of the day. A responsible adult should stay with you for 24 hours following the procedure.  °For the next 24 hours, DO NOT: °-Drive a car °-Operate machinery °-Drink alcoholic beverages °-Take any medication unless instructed by your physician °-Make  any legal decisions or sign important papers. ° °Meals: °Start with liquid foods such as gelatin or soup. Progress to regular foods as tolerated. Avoid greasy, spicy, heavy foods. If nausea and/or vomiting occur, drink only clear liquids until the nausea and/or vomiting subsides. Call your physician if vomiting continues. ° °Special Instructions/Symptoms: °Your throat may feel dry or sore from the anesthesia or the breathing tube placed in your throat during surgery. If this causes discomfort, gargle with warm salt water. The discomfort should disappear within 24 hours. ° °

## 2014-06-17 ENCOUNTER — Encounter (HOSPITAL_BASED_OUTPATIENT_CLINIC_OR_DEPARTMENT_OTHER): Payer: Self-pay | Admitting: General Surgery

## 2014-06-17 NOTE — Anesthesia Postprocedure Evaluation (Signed)
Anesthesia Post Note  Patient: Brittney Santos  Procedure(s) Performed: Procedure(s) (LRB): ANAL EXAM UNDER ANESTHESIA,  LASER ABLATION OF ANAL CONDOLAMATA (N/A)  Anesthesia type: MAC  Patient location: PACU  Post pain: Pain level controlled  Post assessment: Post-op Vital signs reviewed  Last Vitals:  Filed Vitals:   06/16/14 1235  BP: 104/62  Pulse: 69  Temp: 36.2 C  Resp: 16    Post vital signs: Reviewed  Level of consciousness: sedated  Complications: No apparent anesthesia complications

## 2014-07-05 ENCOUNTER — Ambulatory Visit (INDEPENDENT_AMBULATORY_CARE_PROVIDER_SITE_OTHER): Payer: BC Managed Care – PPO | Admitting: General Surgery

## 2014-07-05 ENCOUNTER — Encounter (INDEPENDENT_AMBULATORY_CARE_PROVIDER_SITE_OTHER): Payer: Self-pay | Admitting: General Surgery

## 2014-07-05 VITALS — BP 124/70 | HR 76 | Ht 65.0 in | Wt 121.0 lb

## 2014-07-05 DIAGNOSIS — K6282 Dysplasia of anus: Secondary | ICD-10-CM

## 2014-07-05 NOTE — Patient Instructions (Signed)
Anal Warts and Anal Dysplasia Expanded  What are anal warts? Anal warts (also called "condyloma acuminata") are a condition that affects the area around and inside the anus. They may also affect the skin of the genital area. They first appear as tiny spots or growths, perhaps as small as the head of a pin, and may grow quite large and cover the entire anal area. They usually appear as a flesh or brownish color. Usually, they do not cause pain or discomfort and patients may be unaware that the warts are present. Some patients will experience symptoms such as itching, bleeding, mucus discharge and/or a feeling of a lump or mass in the anal area.  What causes anal warts? Warts are caused by the human papilloma virus (HPV), which is transmitted from person to person by direct contact. HPV is considered to be the most common sexually transmitted disease (STD). You may be upset when you are given this diagnosis and it is important to note that anal intercourse is not necessary to develop anal condylomata. Any contact exposure to the anal area (hand contact, secretions from a sexual partner) can result in HPV infection. Exposure to the virus could have occurred many years ago or from prior sexual partners, but you may have just recently developed the actual warts. How are anal warts diagnosed? Although potentially sensitive and difficult to talk about, your doctor may inquire as to the presence or absence of risk factors to include a history of anal intercourse, a positive HIV test or a chronically weakened immune system (medications for organ transplant patients, inflammatory bowel disease, rheumatoid arthritis, etc).  Physical examination should focus primarily on the anorectal examination and evaluation of the perineum (pelvic region) that includes the penile or vaginal area to look for warts. Digital rectal examination should be performed to rule out any mass. Anoscopy is typically performed to look within the  anal canal for additional warts. This involves inserting a small instrument about the size of a finger into your anus to help visualize the area. Speculum examination may also be performed to aid in vaginal examination in women.  Can I prevent myself from getting warts? The safest way to protect yourself from getting exposed to HPV or any other STD, is to use safe sex techniques. Abstain from sexual contact with individuals who have anal (or genital) warts. Since many individuals may be unaware that they suffer from this condition, sexual abstinence, condom protection or limiting sexual contact to a single partner will reduce the contagious virus that causes warts. However, using condoms whenever having any kind of intercourse may reduce, but not completely eliminate, the risk of HPV infection, as HPV is spread by skin-to-skin contact and can live in areas not covered by a condom. The U[place]S. Food and Drug Administration (FDA) has approved the vaccine Gardasil (vaccine against certain types of HPV that more commonly cause cervical and other HPV-related cancers) in certain patients age 9 to 26 prior to HPV exposure (sexual activity) to prevent the development of HPV- related cancers and associated precancerous lesions (called dysplasia). Your physician may recommend you to be evaluated if you would potentially be a candidate for this vaccine. However, the vaccine's role to prevent anal warts and anal cancer is unknown. Do these warts always need to be removed? Yes. If they are not removed, the warts usually grow larger and multiply. Left untreated, warts may lead to an increased risk of anal cancer in the affected area. Fortunately, the risk of anal   cancer is still very rare. What treatments are available for anal warts? Topical options for treating warts 1. Medications: If warts are very small and are located only on the skin around the anus, they may be treated with a topical medication in the office and  sometimes at home. Common topical medications applied directly to the warts are podophyllin, trichloroacetic acid and bichloroacetic acid. These office treatments do not require anesthesia and only take a few minutes to apply to the warts. Minor burning or discomfort may be experienced after treatment and, thus, most patients can return to work after the procedure. Your physician will recommend when to wash off the medication after treatment. Topical agents that can be applied at home on small warts include Imiquimod or 5-fluorouracial (5-FU), although how well they work to eliminate anal warts completely is unknown. Side effects include skin irritation, burning and painful ulcerations of the skin. If you develop severe side effects, immediately stop using the cream and contact your physician. 2. Cryotherapy: Anal warts may also be treated in the physician's office by freezing the warts with liquid nitrogen. Similar recovery as the topical agents mentioned above is expected. Surgical options for treating warts: Surgery provides immediate results, but must be performed using either a local anesthetic - such as Novocaine - or a general or spinal anesthetic, depending on the number and exact location of warts being treated. Fulguration (burning), surgical excision (removal), or a combination of both, are used to treat larger external and internal anal warts. Must I be hospitalized for surgical treatment? Surgical treatment of anal warts is usually performed as outpatient surgery.  How much time will I lose from work after surgical treatment? Most people are moderately uncomfortable for a few days after treatment, and pain medication may be prescribed. Depending on the extent of the disease, some people return to work the next day, while others may remain out of work for several days to weeks. Pain, discomfort and slight bleeding are expected in the recovery period and may last at some level for several weeks.  Excessive bleeding is abnormal and your physician should be informed immediately if you experience large amounts of bleeding. Clear, yellowish or blood tinged drainage or moisture will be expected for days to weeks after the procedure. Placement of a pad and frequent dressing changes will help lessen the moisture and itching associated with the drainage. Will a single treatment cure the problem? When warts are extensive, your surgeon may wish to perform the surgery in stages. Additionally, recurrent warts are common in over 50% of patients. The virus that causes the warts can live concealed in the skin that appears normal for several months before another wart develops. As new warts develop, they usually can be treated in the physician's office. Sometimes new warts develop so rapidly that office treatment may not be possible or could be quite uncomfortable. In these situations, a second, and occasionally, third outpatient surgical visit may be recommended. How long is treatment usually continued? Follow-up visits are necessary at frequent intervals for several months after the last wart is observed to be certain that no new warts occur. It is important to see your physician on a routine basis as recommended by her/him or if you notice any new lesions or new symptoms (pain, rectal bleeding). What can be done to avoid getting these warts again? In some cases, warts may recur repeatedly after successful removal, since the virus that causes the warts often persists in a dormant state in the   skin. Discuss with you physician how often you should be examined for recurrent warts.  To prevent further spread of HPV, safe sex practices are recommended and include sexual abstinence, condom protection or limiting sexual contact to single partner. As a precaution, sexual partners ought to be checked for warts and other sexual transmitted diseases (STD), even if they have no symptoms. Your physician may also recommend that  you be tested for other STDs.  What is anal dysplasia? Some warts have abnormal changes seen by the pathologist when they look at the removed wart under the microscope. These changes are called anal dysplasia and can be graded as to how advanced their dysplasia or abnormal changes are under the microscope. These changes are referred to by physicians as low-grade and high-grade anal intraepithelial neoplasia (LGAIN/HGAIN). Cells that are becoming malignant or "premalignant", but have not invaded deeper into the skin, are often referred to as HGAIN. While this condition is likely a precursor to anal cancer, this is not anal cancer and is treated differently than anal cancer. Anal dysplasia (LGAIN/HGAIN) is similar to cervical dysplasia (cervical intraepithelial neoplasia or CIN) in that it originates from a HPV infection and can develop into anal and cervical cancer, respectively. Thus, patients with anal dysplasia need close follow up determined by their physician and any new lesions must be evaluated promptly. A gynecologic examination is also recommended in females, as the presence of HGAIN puts a female patient at risk for having CIN. Who is at increased risk for anal dysplasia? Risk factors for anal dysplasia include: 1. Patients with HPV infections (most common) 2. Patients with history of anal intercourse 3. Positive HIV test 4. Cigarette smoking  5. Patients with a weakened immune system from certain medications (solid organ transplantation, RA, IBD) Why do we need to treat anal dysplasia? Once you have anal dysplasia, it rarely disappears. Although still exceedingly uncommon, there is a slight increase risk of anal cancer in patients with a history of anal dysplasia (less than 5%). Its progression in HIV-positive patients seems to be higher. The risk for progression to anal cancer may be as high as 10-50% among HIV-positive patients. How is anal dysplasia diagnosed? Anal dysplasia can be found in  anal warts or sometimes these changes are found incidentally at the time of unrelated anal surgery (i.e. hemorrhoid surgery).  Screening procedures available to detect anal dysplasia include anal cytology and high-resolution anoscopy (HRA). However, they are not universally performed and their role in the management of patients with anal dysplasia is unknown at this time. 1. Anal Papanicolaou (Pap) smear cytology consists of using anal swabs to sample cells from the anal canal and can be used for both screening patients considered high-risk (see list above) and as follow up after anal dysplasia has been treated. Unfortunately, up to 45% of patients can have a false-positive test by anal PAP for anal dysplasia. As well, it is not known if anal PAP improves your outcome or decreases your risk of anal cancer.  2. HRA typically involves the application of temporary stains (3% acetic acid and Lugol's iodine solution) to the anal canal followed by evaluation under high-resolution microscopy to help differentiate normal from abnormal tissue. This is very similar to colposcopy (examination of the cervix) in women who have cervical dysplasia. Directed biopsies are performed for any questionable areas and to identify areas that may need further treatment. How is anal dysplasia treated? 1. Observation alone with close clinical follow-up may be considered for the treatment of   anal dysplasia. There has been much debate regarding the best treatment for anal dysplasia. There is a high risk of recurrence following treatments so physicians may recommend close observation and physical examination every 3-6 months depending on your risk factors and if the screening procedures discussed are available in your area. However, due to the high risk of also having cervical dysplasia, a referral to gynecology is recommended. 2. Topical 5% imiquimod (Aldara) cream or 5% 5-fluorouracil (5-FU) cream may be applied to areas of anal  dysplasia. Local treatment creams may be needed for 9-16 weeks. Up to 90% may have anal lesions disappear, although as many as 50% can recur. Local side effects are very common, occurring in up to 85%, and include skin irritation and hypo-pigmentation (loss of color around the anus), but these stop or reverse after you stop the cream. 3. Photodynamic therapy may be used in select patients. Similar to use in other types of skin cancers, photodynamic therapy has been described in patients with anal dysplasia since 1992. In this process, photosensitizing agents such as 5-aminolevulinic acid creams are applied to the affected area followed by treatment with a specific wavelength laser. Studies have been limited and its role in patients with anal dysplasia is still unknown. 4. Targeted destruction and close clinical long-term follow-up. A variety of surgical techniques to remove anal dysplasia have been used to prevent disease progression. These include wide local excision and targeted therapy using high-resolution anoscopy (HRA). Wide local excision's goal is to remove all of the affected areas with normal surrounding tissue ("negative margins"), although total removal of all disease is often difficult. Sometimes a local flap of normal tissue adjacent to the removed area is used to cover the large defect. There is still a high rate of recurrence of anal dysplasia despite a wide removal of tissue and high rates of complications such as stenosis anal (narrowing of the area) and fecal incontinence. Targeted destruction guided by high-resolution anoscopy is effective to identify, biopsy and destroy anal dysplasia without the long recovery and complications associated with wide local excision. However, there is still a high risk of persistent or recurrent disease, reported in up to 20-80%. Surgical complications such as incontinence and stenosis are generally not seen with HRA, though. Fortunately, both wide local excision  and targeted destruction by HRA have been shown to prevent progression from anal dysplasia to anal cancer. How should I be followed after treatment for anal dysplasia? Patients with anal dysplasia should usually be closely followed long term to prevent or detect recurrence, persistence or progression to anal cancer. Physical examinations may be performed at 3 to 6-month intervals. This approach allows for the treatment of recurrent or persistent dysplasia or the detection of anal cancer. Follow-up generally includes digital rectal examination, anoscopic examination, with or without the aid of magnification or the application of acetic acid and Lugol's solution, and can be performed in an office setting. Anorectal cytology (anal PAP) and/or biopsy may also be included if available in your area. The importance of close follow-up cannot be over emphasized in patients with history of anal dysplasia especially if new lesions develop. What is a colon and rectal surgeon? Colon and rectal surgeons are experts in the surgical and non-surgical treatment of diseases of the colon, rectum and anus. They have completed advanced surgical training in the treatment of these diseases as well as full general surgical training. Board-certified colon and rectal surgeons complete residencies in general surgery and colon and rectal surgery, and pass intensive   examinations conducted by the American Board of Surgery and the American Board of Colon and Rectal Surgery. They are well-versed in the treatment of both benign and malignant diseases of the colon, rectum and anus and are able to perform routine screening examinations and surgically treat conditions if indicated to do so. Disclaimer The American Society of Colon and Rectal Surgeons is dedicated to ensuring high-quality patient care by advancing the science, prevention, and management of disorders and diseases of the colon, rectum, and anus. These brochures are inclusive, and  not prescriptive. Their purpose is to provide information on diseases processes, rather than dictate a specific form of treatment. They are intended for the use of all practitioners, health care workers, and patients who desire information about the management of the conditions addressed by the topics covered in these brochures. It should be recognized that these brochures should not be deemed inclusive of all proper methods of care or exclusive of methods of care reasonably directed to obtaining the same results. The ultimate judgment regarding the propriety of any specific procedure must be made by the physician in light of all the circumstances presented by the individual patient.  author: Jennifer Lowney, MD, FASCRS, on behalf of the ASCRS Public Relations Committee   American Society of Colon & Rectal Surgeons  

## 2014-07-05 NOTE — Progress Notes (Signed)
Brittney Santos is a 58 y.o. female who is status post a laser ablation on 06/16/14.  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  Objective: Filed Vitals:   07/05/14 0934  BP: 124/70  Pulse: 76    General appearance: alert and cooperative GI: normal findings: soft, non-tender anal:  small lesion still healing posteriorly  Incision: healing well   Assessment: s/p  Patient Active Problem List   Diagnosis Date Noted  . 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 Chronic cholecystitis 12/15/2013    Diagnosis 1. Anus, biopsy, posterior - AT LEAST HIGH GRADE SQUAMOUS INTRAEPITHELIAL LESION, ANAL INTRAEPITHELIAL LESION II (AIN-II/MODERATE DYSPLASIA). 2. 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  Plan: Brittney Santos is a [age] y.o. 58 y.o. F who is s/p laser ablation of condyloma.  She seems to be healing well.  Unfortunately, she did have a small internal lesion that was abnormal and biopsied.  This returned as AIN 2.  We discussed this in detail today.  I will see her back in the office for high resolution anoscopy in 6 months to follow up on the AIN.      Marland Kitchen.Vanita PandaAlicia C Saxton Chain, MD [facility], [place] [phone]   07/05/2014 9:56 AM

## 2014-07-09 IMAGING — NM NM HEPATO W/GB/PHARM/[PERSON_NAME]
1 series · 1 of 1 positions shown · non-contrast
Comparison: Ultrasound 10/29/2013

RADIOPHARMACEUTICALS:  [id number] Choletec

CLINICAL DATA: Bloating. Right upper quadrant pain, nausea,
vomiting.

EXAM:
NUCLEAR MEDICINE HEPATOBILIARY IMAGING WITH GALLBLADDER EF
TECHNIQUE: Sequential images of the abdomen were obtained [DATE] minutes
following intravenous administration of radiopharmaceutical. After
slow intravenous infusion of 6.62micrograms Cholecystokinin,
gallbladder ejection fraction was determined. At 30 min, normal
ejection fraction is greater than 30%.

[hepatobiliary · 1 of 1 slices shown]
[im 1/1]
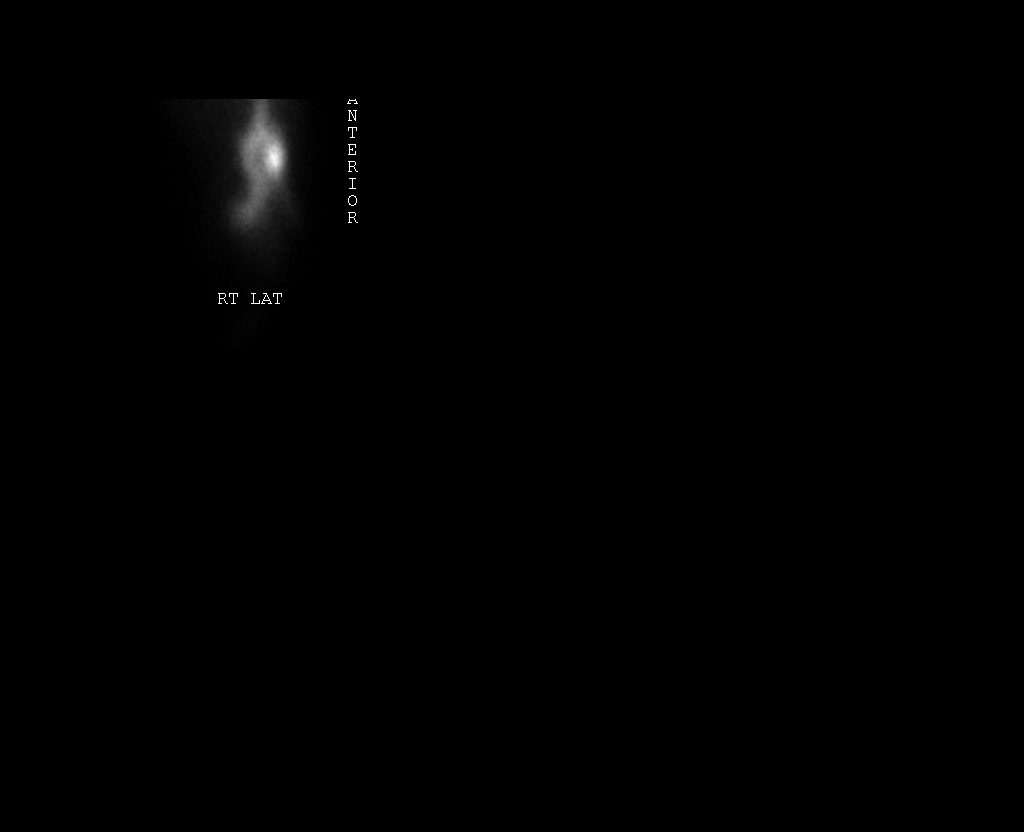

[1 of 1 positions shown; findings below may reference images not displayed]

FINDINGS: There is prompt uptake and excretion of radiotracer by the liver. No
evidence of cystic duct or common duct obstruction. Gall bladder
ejection fraction is 32%. Normal is greater than 30%.

The patient did experience mild symptoms during CCK infusion.
IMPRESSION: Gallbladder ejection fraction lower limits of normal. The patient
experienced mild discomfort/ pain with CCK administration.

## 2014-11-16 ENCOUNTER — Other Ambulatory Visit: Payer: Self-pay | Admitting: Internal Medicine

## 2014-11-16 DIAGNOSIS — Z1231 Encounter for screening mammogram for malignant neoplasm of breast: Secondary | ICD-10-CM

## 2014-12-01 ENCOUNTER — Ambulatory Visit
Admission: RE | Admit: 2014-12-01 | Discharge: 2014-12-01 | Disposition: A | Discharge status: Home / Self Care | Payer: BC Managed Care – PPO | Source: Ambulatory Visit | Attending: Internal Medicine | Admitting: Internal Medicine

## 2014-12-01 DIAGNOSIS — Z1231 Encounter for screening mammogram for malignant neoplasm of breast: Secondary | ICD-10-CM

## 2018-08-08 ENCOUNTER — Inpatient Hospital Stay: Payer: Self-pay | Admitting: Anesthesiology

## 2018-08-08 ENCOUNTER — Emergency Department: Payer: Self-pay

## 2018-08-08 ENCOUNTER — Other Ambulatory Visit: Payer: Self-pay

## 2018-08-08 ENCOUNTER — Encounter: Payer: Self-pay | Admitting: Emergency Medicine

## 2018-08-08 ENCOUNTER — Encounter
Admission: EM | Disposition: A | Discharge status: Home / Self Care | Payer: Self-pay | Source: Ambulatory Visit | Attending: Internal Medicine

## 2018-08-08 ENCOUNTER — Inpatient Hospital Stay
Admission: EM | Admit: 2018-08-08 | Discharge: 2018-08-11 | DRG: 854 | Disposition: A | Discharge status: Home / Self Care | Payer: Self-pay | Source: Ambulatory Visit | Attending: Internal Medicine | Admitting: Internal Medicine

## 2018-08-08 DIAGNOSIS — N12 Tubulo-interstitial nephritis, not specified as acute or chronic: Secondary | ICD-10-CM | POA: Diagnosis present

## 2018-08-08 DIAGNOSIS — N201 Calculus of ureter: Secondary | ICD-10-CM

## 2018-08-08 DIAGNOSIS — Z880 Allergy status to penicillin: Secondary | ICD-10-CM

## 2018-08-08 DIAGNOSIS — Z90710 Acquired absence of both cervix and uterus: Secondary | ICD-10-CM

## 2018-08-08 DIAGNOSIS — Z90722 Acquired absence of ovaries, bilateral: Secondary | ICD-10-CM

## 2018-08-08 DIAGNOSIS — Z9049 Acquired absence of other specified parts of digestive tract: Secondary | ICD-10-CM

## 2018-08-08 DIAGNOSIS — Z87891 Personal history of nicotine dependence: Secondary | ICD-10-CM

## 2018-08-08 DIAGNOSIS — N390 Urinary tract infection, site not specified: Secondary | ICD-10-CM

## 2018-08-08 DIAGNOSIS — Z8049 Family history of malignant neoplasm of other genital organs: Secondary | ICD-10-CM

## 2018-08-08 DIAGNOSIS — K219 Gastro-esophageal reflux disease without esophagitis: Secondary | ICD-10-CM | POA: Diagnosis present

## 2018-08-08 DIAGNOSIS — Z803 Family history of malignant neoplasm of breast: Secondary | ICD-10-CM

## 2018-08-08 DIAGNOSIS — N10 Acute tubulo-interstitial nephritis: Secondary | ICD-10-CM

## 2018-08-08 DIAGNOSIS — Z86010 Personal history of colonic polyps: Secondary | ICD-10-CM

## 2018-08-08 DIAGNOSIS — Z85828 Personal history of other malignant neoplasm of skin: Secondary | ICD-10-CM

## 2018-08-08 DIAGNOSIS — N132 Hydronephrosis with renal and ureteral calculous obstruction: Secondary | ICD-10-CM

## 2018-08-08 DIAGNOSIS — Z8541 Personal history of malignant neoplasm of cervix uteri: Secondary | ICD-10-CM

## 2018-08-08 DIAGNOSIS — A419 Sepsis, unspecified organism: Secondary | ICD-10-CM

## 2018-08-08 DIAGNOSIS — N136 Pyonephrosis: Secondary | ICD-10-CM | POA: Diagnosis present

## 2018-08-08 DIAGNOSIS — R509 Fever, unspecified: Secondary | ICD-10-CM

## 2018-08-08 DIAGNOSIS — A4151 Sepsis due to Escherichia coli [E. coli]: Principal | ICD-10-CM | POA: Diagnosis present

## 2018-08-08 DIAGNOSIS — Z87440 Personal history of urinary (tract) infections: Secondary | ICD-10-CM

## 2018-08-08 HISTORY — PX: CYSTOSCOPY WITH RETROGRADE PYELOGRAM, URETEROSCOPY AND STENT PLACEMENT: SHX5789

## 2018-08-08 LAB — CBC
HEMATOCRIT: 30.2 % — AB (ref 35.0–47.0)
Hemoglobin: 10.9 g/dL — ABNORMAL LOW (ref 12.0–16.0)
MCH: 31.9 pg (ref 26.0–34.0)
MCHC: 36.3 g/dL — AB (ref 32.0–36.0)
MCV: 88 fL (ref 80.0–100.0)
Platelets: 231 10*3/uL (ref 150–440)
RBC: 3.43 MIL/uL — ABNORMAL LOW (ref 3.80–5.20)
RDW: 12.2 % (ref 11.5–14.5)
WBC: 13.3 10*3/uL — ABNORMAL HIGH (ref 3.6–11.0)

## 2018-08-08 LAB — BASIC METABOLIC PANEL
ANION GAP: 9 (ref 5–15)
BUN: 13 mg/dL (ref 8–23)
CHLORIDE: 103 mmol/L (ref 98–111)
CO2: 25 mmol/L (ref 22–32)
Calcium: 8.8 mg/dL — ABNORMAL LOW (ref 8.9–10.3)
Creatinine, Ser: 0.86 mg/dL (ref 0.44–1.00)
GFR calc Af Amer: >60 mL/min (ref >60)
GLUCOSE: 104 mg/dL — AB (ref 70–99)
Potassium: 3.6 mmol/L (ref 3.5–5.1)
Sodium: 137 mmol/L (ref 135–145)

## 2018-08-08 LAB — URINALYSIS, COMPLETE (UACMP) WITH MICROSCOPIC
Bilirubin Urine: NEGATIVE
GLUCOSE, UA: NEGATIVE mg/dL
Ketones, ur: 20 mg/dL — AB
Nitrite: POSITIVE — AB
PROTEIN: 30 mg/dL — AB
SPECIFIC GRAVITY, URINE: 1.012 (ref 1.005–1.030)
pH: 5 (ref 5.0–8.0)

## 2018-08-08 LAB — LACTIC ACID, PLASMA: Lactic Acid, Venous: 1.5 mmol/L (ref 0.5–1.9)

## 2018-08-08 SURGERY — CYSTOURETEROSCOPY, WITH RETROGRADE PYELOGRAM AND STENT INSERTION
Anesthesia: General | Laterality: Left

## 2018-08-08 MED ORDER — PROPOFOL 10 MG/ML IV BOLUS
INTRAVENOUS | Status: AC
  Filled 2018-08-08: qty 20

## 2018-08-08 MED ORDER — FENTANYL CITRATE (PF) 100 MCG/2ML IJ SOLN
INTRAMUSCULAR | Status: AC
  Filled 2018-08-08: qty 2

## 2018-08-08 MED ORDER — ROCURONIUM BROMIDE 100 MG/10ML IV SOLN
INTRAVENOUS | Status: DC | PRN
  Administered 2018-08-08: .5 mg via INTRAVENOUS

## 2018-08-08 MED ORDER — ENOXAPARIN SODIUM 40 MG/0.4ML ~~LOC~~ SOLN
40.0000 mg | SUBCUTANEOUS | Status: DC
  Administered 2018-08-09: 40 mg via SUBCUTANEOUS
  Filled 2018-08-08 (×3): qty 0.4

## 2018-08-08 MED ORDER — FENTANYL CITRATE (PF) 100 MCG/2ML IJ SOLN
25.0000 ug | INTRAMUSCULAR | Status: DC | PRN
  Administered 2018-08-08 (×2): 25 ug via INTRAVENOUS

## 2018-08-08 MED ORDER — PHENYLEPHRINE HCL 10 MG/ML IJ SOLN
INTRAMUSCULAR | Status: AC
  Filled 2018-08-08: qty 1

## 2018-08-08 MED ORDER — DEXAMETHASONE SODIUM PHOSPHATE 10 MG/ML IJ SOLN
INTRAMUSCULAR | Status: DC | PRN
  Administered 2018-08-08: 5 mg via INTRAVENOUS

## 2018-08-08 MED ORDER — PROMETHAZINE HCL 25 MG/ML IJ SOLN: 6.2500 mg | INTRAMUSCULAR | Status: DC | PRN

## 2018-08-08 MED ORDER — MIDAZOLAM HCL 2 MG/2ML IJ SOLN
INTRAMUSCULAR | Status: AC
  Filled 2018-08-08: qty 2

## 2018-08-08 MED ORDER — LACTATED RINGERS IV SOLN
INTRAVENOUS | Status: DC | PRN
  Administered 2018-08-08: 23:00:00 via INTRAVENOUS

## 2018-08-08 MED ORDER — ACETAMINOPHEN 650 MG RE SUPP: 650.0000 mg | Freq: Four times a day (QID) | RECTAL | Status: DC | PRN

## 2018-08-08 MED ORDER — ROCURONIUM BROMIDE 50 MG/5ML IV SOLN
INTRAVENOUS | Status: AC
  Filled 2018-08-08: qty 1

## 2018-08-08 MED ORDER — SODIUM CHLORIDE 0.9 % IV BOLUS
1000.0000 mL | Freq: Once | INTRAVENOUS | Status: AC
  Administered 2018-08-08: 1000 mL via INTRAVENOUS

## 2018-08-08 MED ORDER — SODIUM CHLORIDE 0.9 % IV SOLN
1.0000 g | Freq: Once | INTRAVENOUS | Status: AC
  Administered 2018-08-08: 1 g via INTRAVENOUS
  Filled 2018-08-08: qty 10

## 2018-08-08 MED ORDER — LIDOCAINE HCL (PF) 2 % IJ SOLN
INTRAMUSCULAR | Status: AC
  Filled 2018-08-08: qty 10

## 2018-08-08 MED ORDER — BISACODYL 5 MG PO TBEC
5.0000 mg | DELAYED_RELEASE_TABLET | Freq: Every day | ORAL | Status: DC | PRN
  Filled 2018-08-08: qty 1

## 2018-08-08 MED ORDER — ONDANSETRON HCL 4 MG PO TABS: 4.0000 mg | ORAL_TABLET | Freq: Four times a day (QID) | ORAL | Status: DC | PRN

## 2018-08-08 MED ORDER — ACETAMINOPHEN 500 MG PO TABS
1000.0000 mg | ORAL_TABLET | Freq: Once | ORAL | Status: AC
  Administered 2018-08-08: 1000 mg via ORAL
  Filled 2018-08-08: qty 2

## 2018-08-08 MED ORDER — SUGAMMADEX SODIUM 200 MG/2ML IV SOLN
INTRAVENOUS | Status: DC | PRN
  Administered 2018-08-08: 50 mg via INTRAVENOUS

## 2018-08-08 MED ORDER — MIDAZOLAM HCL 2 MG/2ML IJ SOLN
INTRAMUSCULAR | Status: DC | PRN
  Administered 2018-08-08: 2 mg via INTRAVENOUS

## 2018-08-08 MED ORDER — ONDANSETRON HCL 4 MG/2ML IJ SOLN
INTRAMUSCULAR | Status: AC
  Filled 2018-08-08: qty 2

## 2018-08-08 MED ORDER — IOTHALAMATE MEGLUMINE 43 % IV SOLN
INTRAVENOUS | Status: DC | PRN
  Administered 2018-08-08: 6 mL

## 2018-08-08 MED ORDER — DOCUSATE SODIUM 100 MG PO CAPS
100.0000 mg | ORAL_CAPSULE | Freq: Two times a day (BID) | ORAL | Status: DC
  Administered 2018-08-09 – 2018-08-11 (×6): 100 mg via ORAL
  Filled 2018-08-08 (×6): qty 1

## 2018-08-08 MED ORDER — ONDANSETRON HCL 4 MG/2ML IJ SOLN
4.0000 mg | Freq: Four times a day (QID) | INTRAMUSCULAR | Status: DC | PRN
  Administered 2018-08-08 – 2018-08-11 (×4): 4 mg via INTRAVENOUS
  Filled 2018-08-08 (×3): qty 2

## 2018-08-08 MED ORDER — EPHEDRINE SULFATE 50 MG/ML IJ SOLN
INTRAMUSCULAR | Status: AC
  Filled 2018-08-08: qty 1

## 2018-08-08 MED ORDER — IOHEXOL 300 MG/ML  SOLN
100.0000 mL | Freq: Once | INTRAMUSCULAR | Status: AC | PRN
  Administered 2018-08-08: 100 mL via INTRAVENOUS

## 2018-08-08 MED ORDER — SUCCINYLCHOLINE CHLORIDE 20 MG/ML IJ SOLN
INTRAMUSCULAR | Status: DC | PRN
  Administered 2018-08-08: 100 mg via INTRAVENOUS

## 2018-08-08 MED ORDER — FENTANYL CITRATE (PF) 100 MCG/2ML IJ SOLN
INTRAMUSCULAR | Status: AC
  Administered 2018-08-08: 25 ug via INTRAVENOUS
  Filled 2018-08-08: qty 2

## 2018-08-08 MED ORDER — FENTANYL CITRATE (PF) 100 MCG/2ML IJ SOLN
INTRAMUSCULAR | Status: DC | PRN
  Administered 2018-08-08 (×2): 25 ug via INTRAVENOUS
  Administered 2018-08-08: 50 ug via INTRAVENOUS

## 2018-08-08 MED ORDER — ACETAMINOPHEN 325 MG PO TABS
650.0000 mg | ORAL_TABLET | Freq: Four times a day (QID) | ORAL | Status: DC | PRN
  Administered 2018-08-10 – 2018-08-11 (×2): 650 mg via ORAL
  Filled 2018-08-08 (×2): qty 2

## 2018-08-08 MED ORDER — EPHEDRINE SULFATE 50 MG/ML IJ SOLN
INTRAMUSCULAR | Status: DC | PRN
  Administered 2018-08-08 (×2): 5 mg via INTRAVENOUS

## 2018-08-08 MED ORDER — TRAZODONE HCL 50 MG PO TABS: 25.0000 mg | ORAL_TABLET | Freq: Every evening | ORAL | Status: DC | PRN

## 2018-08-08 MED ORDER — PROPOFOL 10 MG/ML IV BOLUS
INTRAVENOUS | Status: DC | PRN
  Administered 2018-08-08: 100 mg via INTRAVENOUS

## 2018-08-08 MED ORDER — PHENYLEPHRINE HCL 10 MG/ML IJ SOLN
INTRAMUSCULAR | Status: DC | PRN
  Administered 2018-08-08: 25 ug via INTRAVENOUS

## 2018-08-08 MED ORDER — SODIUM CHLORIDE 0.9 % IV SOLN
INTRAVENOUS | Status: DC
  Administered 2018-08-08 – 2018-08-11 (×6): via INTRAVENOUS

## 2018-08-08 MED ORDER — LEVOFLOXACIN IN D5W 750 MG/150ML IV SOLN
750.0000 mg | INTRAVENOUS | Status: DC
Start: 2018-08-08 — End: 2018-08-11
  Administered 2018-08-08 – 2018-08-10 (×3): 750 mg via INTRAVENOUS
  Filled 2018-08-08 (×4): qty 150

## 2018-08-08 MED ORDER — SUGAMMADEX SODIUM 200 MG/2ML IV SOLN
INTRAVENOUS | Status: AC
  Filled 2018-08-08: qty 2

## 2018-08-08 MED ORDER — LIDOCAINE HCL (CARDIAC) PF 100 MG/5ML IV SOSY
PREFILLED_SYRINGE | INTRAVENOUS | Status: DC | PRN
  Administered 2018-08-08: 30 mg via INTRAVENOUS

## 2018-08-08 MED ORDER — SUCCINYLCHOLINE CHLORIDE 20 MG/ML IJ SOLN
INTRAMUSCULAR | Status: AC
  Filled 2018-08-08: qty 1

## 2018-08-08 SURGICAL SUPPLY — 29 items
ADAPTER SCOPE UROLOK II (MISCELLANEOUS) ×3 IMPLANT
BAG DRAIN CYSTO-URO LG1000N (MISCELLANEOUS) ×3 IMPLANT
BAG URINE DRAIN UROCATCH STRL (MISCELLANEOUS) ×3 IMPLANT
BASKET 3WIRE GEMINI 2.4X120 (MISCELLANEOUS) IMPLANT
BASKET LASER NITINOL 1.9FR (BASKET) IMPLANT
BASKET ZERO TIP 1.9FR (BASKET) IMPLANT
CATH FOLEY 2WAY  5CC 16FR (CATHETERS) ×2
CATH URETL 5X70 OPEN END (CATHETERS) ×3 IMPLANT
CATH URTH 16FR FL 2W BLN LF (CATHETERS) ×1 IMPLANT
CNTNR SPEC 2.5X3XGRAD LEK (MISCELLANEOUS)
CONT SPEC 4OZ STER OR WHT (MISCELLANEOUS)
CONTAINER SPEC 2.5X3XGRAD LEK (MISCELLANEOUS) IMPLANT
GLOVE BIO SURGEON STRL SZ7.5 (GLOVE) ×6 IMPLANT
GOWN STRL REUS W/ TWL LRG LVL4 (GOWN DISPOSABLE) ×2 IMPLANT
GOWN STRL REUS W/TWL LRG LVL4 (GOWN DISPOSABLE) ×4
INTRODUCER DILATOR DOUBLE (INTRODUCER) IMPLANT
KIT INTRODUCER PERC 15FR PTFE (INTRODUCER) IMPLANT
PACK CYSTO AR (MISCELLANEOUS) ×3 IMPLANT
SENSORWIRE 0.038 NOT ANGLED (WIRE) ×3
SET CYSTO W/LG BORE CLAMP LF (SET/KITS/TRAYS/PACK) ×3 IMPLANT
SHEATH URETERAL 12FRX35CM (MISCELLANEOUS) IMPLANT
SOL .9 NS 3000ML IRR  AL (IV SOLUTION) ×2
SOL .9 NS 3000ML IRR UROMATIC (IV SOLUTION) ×1 IMPLANT
SOL PREP PVP 2OZ (MISCELLANEOUS) ×3
SOLUTION PREP PVP 2OZ (MISCELLANEOUS) ×1 IMPLANT
STENT DBL PTAIL 4.7X26 (STENTS) ×3 IMPLANT
SURGILUBE 2OZ TUBE FLIPTOP (MISCELLANEOUS) ×3 IMPLANT
SYRINGE IRR TOOMEY STRL 70CC (SYRINGE) ×3 IMPLANT
WIRE SENSOR 0.038 NOT ANGLED (WIRE) ×1 IMPLANT

## 2018-08-08 NOTE — Anesthesia Procedure Notes (Addendum)
Performed by: Tonia Ghentook-Martin, Evola Hollis Pre-anesthesia Checklist: Patient identified, Emergency Drugs available, Suction available, Patient being monitored and Timeout performed Patient Re-evaluated:Patient Re-evaluated prior to induction Oxygen Delivery Method: Circle system utilized Preoxygenation: Pre-oxygenation with 100% oxygen Induction Type: IV induction Laryngoscope Size: Miller and 3 Grade View: Grade I Tube type: Oral Tube size: 6.5 mm Number of attempts: 1 Airway Equipment and Method: Stylet Placement Confirmation: positive ETCO2,  ETT inserted through vocal cords under direct vision,  CO2 detector and breath sounds checked- equal and bilateral Secured at: 19 cm Tube secured with: Tape Dental Injury: Teeth and Oropharynx as per pre-operative assessment

## 2018-08-08 NOTE — ED Triage Notes (Signed)
[facility], pressure in her bladder, no burning with urination, states she has an UTI, states it does not feel like kidney stones.

## 2018-08-08 NOTE — Consult Note (Signed)
Consultation: Left ureteral stone, hydronephrosis, UTI, fever Requested by: Dr. Willy Eddy  History of present illness: This is a 62 year old white female with a history of kidney stones who presented with left flank pain and was noted to be febrile and tachycardic on presentation.  She said her left flank pain began this morning and she also had some bladder pressure but no dysuria.  When she got to the emergency department she said her right flank also hurt. CT scan was done which revealed a 7 mm left proximal ureteral stones.  Her white count was 13 with a creatinine of 0.86.  Her lactic acid was 1.5.  UA showed many bacteria and greater than 50 white cells and 21-50 at cells.  She was given fluids and antibiotics.  She has a history of pyelonephritis complicated by renal abscess which she thinks was about 2011.  She does have a history of kidney stones and recalls having a stent in the late 90s.  Past Medical History:  Diagnosis Date  . Anal condyloma   . Arthritis   . Chronic diarrhea    S/P  CHOLECYSTECTOMY  . GERD (gastroesophageal reflux disease)    WATCHES DIET  . History of basal cell carcinoma excision    X2   FROM  NOSE  . History of cervical cancer    DX  1989--  S/P  TAH WITH BSO IN 1999  . History of colon polyps   . History of kidney stones   . Renal calculus, left    2011   Past Surgical History:  Procedure Laterality Date  . ABDOMINAL HYSTERECTOMY    . CERVICAL CONIZATION W/BX  1998  . CHOLECYSTECTOMY N/A 12/29/2013   Procedure: LAPAROSCOPIC CHOLECYSTECTOMY;  Surgeon: Almond Lint, MD;  Location: [facility];  Service: General;  Laterality: N/A;  . COLONOSCOPY W/ POLYPECTOMY  X2   LAST ONE  NOV  2014  . EXTRACORPOREAL SHOCK WAVE LITHOTRIPSY  1999  . LASER ABLATION CONDOLAMATA N/A 06/16/2014   Procedure: ANAL EXAM UNDER ANESTHESIA,  LASER ABLATION OF ANAL CONDOLAMATA;  Surgeon: Romie Levee, MD;  Location: [facility];  Service: General;  Laterality:  N/A;  . MOHS SURGERY  X2  LAST ONE 2011  . TOTAL ABDOMINAL HYSTERECTOMY W/ BILATERAL SALPINGOOPHORECTOMY  1999    Home Medications:  Medications Prior to Admission  Medication Sig Dispense Refill Last Dose  . acetaminophen (TYLENOL) 500 MG tablet Take 500 mg by mouth every 6 (six) hours as needed for mild pain or moderate pain.   PRN at PRN   Allergies:  Allergies  Allergen Reactions  . Lactose Intolerance (Gi)   . Penicillins Itching    Has patient had a PCN reaction causing immediate rash, facial/tongue/throat swelling, SOB or lightheadedness with hypotension: No Has patient had a PCN reaction causing severe rash involving mucus membranes or skin necrosis: No Has patient had a PCN reaction that required hospitalization: No Has patient had a PCN reaction occurring within the last 10 years: No If all of the above answers are "NO", then may proceed with Cephalosporin use.     Family History  Problem Relation Age of Onset  . Cancer Mother        uterus  . Cancer Paternal Aunt        breast   Social History:  reports that she quit smoking about 6 years ago. Her smoking use included cigarettes. She has a 38.00 pack-year smoking history. She has never used smokeless tobacco. She reports  that she drinks alcohol. She reports that she does not use drugs.  ROS: A complete review of systems was performed.  All systems are negative except for pertinent findings as noted. Review of Systems  All other systems reviewed and are negative.    Physical Exam:  Vital signs in last 24 hours: Temp:  [97.8 F (36.6 C)-102.6 F (39.2 C)] 97.8 F (36.6 C) (08/09 2022) Pulse Rate:  [64-97] 64 (08/09 2022) Resp:  [16-20] 20 (08/09 2022) BP: (97-132)/(52-69) 98/55 (08/09 2022) SpO2:  [99 %-100 %] 99 % (08/09 2022) Weight:  [49.9 kg] 49.9 kg (08/09 1417) General:  Alert and oriented, No acute distress HEENT: Normocephalic, atraumatic Cardiovascular: Regular rate and rhythm Lungs: Regular rate  and effort Abdomen: Soft, nontender, nondistended, no abdominal masses Extremities: No edema Neurologic: Grossly intact  Laboratory Data:  Results for orders placed or performed during the hospital encounter of 08/08/18 (from the past 24 hour(s))  Basic metabolic panel     Status: Abnormal   Collection Time: 08/08/18  2:19 PM  Result Value Ref Range   Sodium 137 135 - 145 mmol/L   Potassium 3.6 3.5 - 5.1 mmol/L   Chloride 103 98 - 111 mmol/L   CO2 25 22 - 32 mmol/L   Glucose, Bld 104 (H) 70 - 99 mg/dL   BUN 13 8 - 23 mg/dL   Creatinine, Ser 1.910.86 0.44 - 1.00 mg/dL   Calcium 8.8 (L) 8.9 - 10.3 mg/dL   GFR calc non Af Amer >60 >60 mL/min   GFR calc Af Amer >60 >60 mL/min   Anion gap 9 5 - 15  CBC     Status: Abnormal   Collection Time: 08/08/18  2:19 PM  Result Value Ref Range   WBC 13.3 (H) 3.6 - 11.0 K/uL   RBC 3.43 (L) 3.80 - 5.20 MIL/uL   Hemoglobin 10.9 (L) 12.0 - 16.0 g/dL   HCT 47.830.2 (L) 29.535.0 - 62.147.0 %   MCV 88.0 80.0 - 100.0 fL   MCH 31.9 26.0 - 34.0 pg   MCHC 36.3 (H) 32.0 - 36.0 g/dL   RDW 30.812.2 65.711.5 - 84.614.5 %   Platelets 231 150 - 440 K/uL  Lactic acid, plasma     Status: None   Collection Time: 08/08/18  2:19 PM  Result Value Ref Range   Lactic Acid, Venous 1.5 0.5 - 1.9 mmol/L  Urinalysis, Complete w Microscopic     Status: Abnormal   Collection Time: 08/08/18  5:19 PM  Result Value Ref Range   Color, Urine YELLOW (A) YELLOW   APPearance CLOUDY (A) CLEAR   Specific Gravity, Urine 1.012 1.005 - 1.030   pH 5.0 5.0 - 8.0   Glucose, UA NEGATIVE NEGATIVE mg/dL   Hgb urine dipstick LARGE (A) NEGATIVE   Bilirubin Urine NEGATIVE NEGATIVE   Ketones, ur 20 (A) NEGATIVE mg/dL   Protein, ur 30 (A) NEGATIVE mg/dL   Nitrite POSITIVE (A) NEGATIVE   Leukocytes, UA LARGE (A) NEGATIVE   RBC / HPF 21-50 0 - 5 RBC/hpf   WBC, UA >50 (H) 0 - 5 WBC/hpf   Bacteria, UA MANY (A) NONE SEEN   Squamous Epithelial / LPF 6-10 0 - 5   Mucus PRESENT    No results found for this or any  previous visit (from the past 240 hour(s)). Creatinine: Recent Labs    08/08/18 1419  CREATININE 0.86    Impression/Assessment/Plan:  I discussed with the patient and her 2 female companions the  nature, potential benefits, risks and alternatives to cystoscopy with left retrograde pyelogram and left ureteral stent placement, including side effects of the proposed treatment, the likelihood of the patient achieving the goals of the procedure, and any potential problems that might occur during the procedure or recuperation. All questions answered. Patient elects to proceed.  We discussed stent pain among other risks as well as the rationale for a staged procedure and the importance of follow-up ureteroscopy.   We've been on hold due to an urgent orthopedic case, but the pt has been stable and feeling better.   Jerilee Field 08/08/2018, 9:21 PM

## 2018-08-08 NOTE — ED Provider Notes (Signed)
[facility] Emergency Department Provider Note    First MD Initiated Contact with Patient 08/08/18 1633     (approximate)  I have reviewed the triage vital signs and the nursing notes.   HISTORY  Chief Complaint Recurrent UTI and Flank Pain    HPI Brittney Santos is a 62 y.o. female history of pyelonephritis comp gated by renal abscess in 2016 presents the ER with fever x1 day dysuria and right flank pain.  Also has history of kidney stones.  Is not been on any recent antibiotics.  Has felt nausea has also had a dry nonproductive cough.  She does use a vape pen.  No diarrhea or constipation.  States the pain is mild to moderate in severity.    Past Medical History:  Diagnosis Date  . Anal condyloma   . Arthritis   . Chronic diarrhea    S/P  CHOLECYSTECTOMY  . GERD (gastroesophageal reflux disease)    WATCHES DIET  . History of basal cell carcinoma excision    X2   FROM  NOSE  . History of cervical cancer    DX  1989--  S/P  TAH WITH BSO IN 1999  . History of colon polyps   . History of kidney stones   . Renal calculus, left    2011   Family History  Problem Relation Age of Onset  . Cancer Mother        uterus  . Cancer Paternal Aunt        breast   Past Surgical History:  Procedure Laterality Date  . ABDOMINAL HYSTERECTOMY    . CERVICAL CONIZATION W/BX  1998  . CHOLECYSTECTOMY N/A 12/29/2013   Procedure: LAPAROSCOPIC CHOLECYSTECTOMY;  Surgeon: Almond Lint, MD;  Location: [facility];  Service: General;  Laterality: N/A;  . COLONOSCOPY W/ POLYPECTOMY  X2   LAST ONE  NOV  2014  . EXTRACORPOREAL SHOCK WAVE LITHOTRIPSY  1999  . LASER ABLATION CONDOLAMATA N/A 06/16/2014   Procedure: ANAL EXAM UNDER ANESTHESIA,  LASER ABLATION OF ANAL CONDOLAMATA;  Surgeon: Romie Levee, MD;  Location: [facility];  Service: General;  Laterality: N/A;  . MOHS SURGERY  X2  LAST ONE 2011  . TOTAL ABDOMINAL HYSTERECTOMY W/ BILATERAL  SALPINGOOPHORECTOMY  1999   Patient Active Problem List   Diagnosis Date Noted  . Pyelonephritis 08/08/2018  . 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 Chronic cholecystitis 12/15/2013      Prior to Admission medications   Medication Sig Start Date End Date Taking? Authorizing Provider  acetaminophen (TYLENOL) 500 MG tablet Take 500 mg by mouth every 6 (six) hours as needed for mild pain or moderate pain.   Yes [provider]    Allergies Lactose intolerance (gi) and Penicillins    Social History Social History   Tobacco Use  . Smoking status: Former Smoker    Packs/day: 1.00    Years: 38.00    Pack years: 38.00    Types: Cigarettes    Last attempt to quit: 01/01/2012    Years since quitting: 6.6  . Smokeless tobacco: Never Used  Substance Use Topics  . Alcohol use: Yes    Comment: occasionally  . Drug use: No    Review of Systems Patient denies headaches, rhinorrhea, blurry vision, numbness, shortness of breath, chest pain, edema, cough, abdominal pain, nausea, vomiting, diarrhea, dysuria, fevers, rashes or hallucinations unless otherwise stated above in HPI. ____________________________________________   PHYSICAL EXAM:  VITAL SIGNS: Vitals:   08/08/18 1730 08/08/18 1858  BP: 132/69 (!) 97/52  Pulse: 91 71  Resp: 16 20  Temp:  99.7 F (37.6 C)  SpO2: 100% 99%    Constitutional: Alert and oriented.  Eyes: Conjunctivae are normal.  Head: Atraumatic. Nose: No congestion/rhinnorhea. Mouth/Throat: Mucous membranes are moist.   Neck: No stridor. Painless ROM.  Cardiovascular: Normal rate, regular rhythm. Grossly normal heart sounds.  Good peripheral circulation. Respiratory: Normal respiratory effort.  No retractions. Lungs CTAB. Gastrointestinal: Soft and nontender. No distention. No abdominal bruits. + right CVA tenderness. Genitourinary: deferred Musculoskeletal: No lower extremity tenderness nor edema.  No joint  effusions. Neurologic:  Normal speech and language. No gross focal neurologic deficits are appreciated. No facial droop Skin:  Skin is warm, dry and intact. No rash noted. Psychiatric: Mood and affect are normal. Speech and behavior are normal.  ____________________________________________   LABS (all labs ordered are listed, but only abnormal results are displayed)  Results for orders placed or performed during the hospital encounter of 08/08/18 (from the past 24 hour(s))  Basic metabolic panel     Status: Abnormal   Collection Time: 08/08/18  2:19 PM  Result Value Ref Range   Sodium 137 135 - 145 mmol/L   Potassium 3.6 3.5 - 5.1 mmol/L   Chloride 103 98 - 111 mmol/L   CO2 25 22 - 32 mmol/L   Glucose, Bld 104 (H) 70 - 99 mg/dL   BUN 13 8 - 23 mg/dL   Creatinine, Ser 8.290.86 0.44 - 1.00 mg/dL   Calcium 8.8 (L) 8.9 - 10.3 mg/dL   GFR calc non Af Amer >60 >60 mL/min   GFR calc Af Amer >60 >60 mL/min   Anion gap 9 5 - 15  CBC     Status: Abnormal   Collection Time: 08/08/18  2:19 PM  Result Value Ref Range   WBC 13.3 (H) 3.6 - 11.0 K/uL   RBC 3.43 (L) 3.80 - 5.20 MIL/uL   Hemoglobin 10.9 (L) 12.0 - 16.0 g/dL   HCT 56.230.2 (L) 13.035.0 - 86.547.0 %   MCV 88.0 80.0 - 100.0 fL   MCH 31.9 26.0 - 34.0 pg   MCHC 36.3 (H) 32.0 - 36.0 g/dL   RDW 78.412.2 69.611.5 - 29.514.5 %   Platelets 231 150 - 440 K/uL  Lactic acid, plasma     Status: None   Collection Time: 08/08/18  2:19 PM  Result Value Ref Range   Lactic Acid, Venous 1.5 0.5 - 1.9 mmol/L  Urinalysis, Complete w Microscopic     Status: Abnormal   Collection Time: 08/08/18  5:19 PM  Result Value Ref Range   Color, Urine YELLOW (A) YELLOW   APPearance CLOUDY (A) CLEAR   Specific Gravity, Urine 1.012 1.005 - 1.030   pH 5.0 5.0 - 8.0   Glucose, UA NEGATIVE NEGATIVE mg/dL   Hgb urine dipstick LARGE (A) NEGATIVE   Bilirubin Urine NEGATIVE NEGATIVE   Ketones, ur 20 (A) NEGATIVE mg/dL   Protein, ur 30 (A) NEGATIVE mg/dL   Nitrite POSITIVE (A) NEGATIVE    Leukocytes, UA LARGE (A) NEGATIVE   RBC / HPF 21-50 0 - 5 RBC/hpf   WBC, UA >50 (H) 0 - 5 WBC/hpf   Bacteria, UA MANY (A) NONE SEEN   Squamous Epithelial / LPF 6-10 0 - 5   Mucus PRESENT    ____________________________________________ ____________________________________________  RADIOLOGY  I personally reviewed all radiographic images ordered to evaluate for the above  acute complaints and reviewed radiology reports and findings.  These findings were personally discussed with the patient.  Please see medical record for radiology report.  ____________________________________________   PROCEDURES  Procedure(s) performed:  .Critical Care Performed by: Willy Eddy, MD Authorized by: Willy Eddy, MD   Critical care provider statement:    Critical care time (minutes):  40   Critical care time was exclusive of:  Separately billable procedures and treating other patients   Critical care was necessary to treat or prevent imminent or life-threatening deterioration of the following conditions:  Sepsis   Critical care was time spent personally by me on the following activities:  Development of treatment plan with patient or surrogate, discussions with consultants, evaluation of patient's response to treatment, examination of patient, obtaining history from patient or surrogate, ordering and performing treatments and interventions, ordering and review of laboratory studies, ordering and review of radiographic studies, pulse oximetry, re-evaluation of patient's condition and review of old charts      Critical Care performed: yes  ____________________________________________   INITIAL IMPRESSION / ASSESSMENT AND PLAN / ED COURSE  Pertinent labs & imaging results that were available during my care of the patient were reviewed by me and considered in my medical decision making (see chart for details).   DDX: pyelo, stone, abscess, uri, pna, appy, cholecystitis  Cedar Roseman is a 62 y.o. who presents to the ED with symptoms as described above.  Patient is febrile mildly tachycardic but normotensive.  There is a white count and urinalysis from clinic today does show nitrite positive UTI.  Based on her symptomatology and history of complicated pyelonephritis with history of abscess will order CT imaging to further evaluate.  Initiate IV antibiotics as well as IV pain medication and IV fluids.  Clinical Course as of Aug 09 2015  Fri Aug 08, 2018  1804 Patient does have evidence of obstructing left ureteral stone just below the UPJ with associated hydro-and areas of left kidney concerning for pyelonephritis.  Will consult urology.  Patient receiving IV antibiotics.  We will continue with the fluids for resuscitation.   [PR]    Clinical Course User Index [PR] Willy Eddy, MD     As part of my medical decision making, I reviewed the following data within the electronic MEDICAL RECORD NUMBER Nursing notes reviewed and incorporated, Labs reviewed, notes from prior ED visits.   ____________________________________________   FINAL CLINICAL IMPRESSION(S) / ED DIAGNOSES  Final diagnoses:  Sepsis, due to unspecified organism (HCC)  Acute pyelonephritis  Ureterolithiasis      NEW MEDICATIONS STARTED DURING THIS VISIT:  Current Discharge Medication List       Note:  This document was prepared using Dragon voice recognition software and may include unintentional dictation errors.    Willy Eddy, MD 08/08/18 2016

## 2018-08-08 NOTE — H&P (Signed)
[facility] -  at [facility]   PATIENT NAME: Brittney Santos    MR#:  [id number]  DATE OF BIRTH:  1956-12-21  DATE OF ADMISSION:  08/08/2018  PRIMARY CARE PHYSICIAN: Ralene Ok, MD   REQUESTING/REFERRING PHYSICIAN: Dr. Willy Eddy  CHIEF COMPLAINT: Left flank pain   Chief Complaint  Patient presents with  . Recurrent UTI  . Flank Pain    HISTORY OF PRESENT ILLNESS:  Brittney Santos  is a 62 y.o. female with a known history of previous kidney stones, arthritis sent from urgent care because of fever, chills, left flank pain .  The patient started to have leg symptoms couple of days ago, since last night started to have chills, experiencing left flank pain since this morning, dysuria.  Fever 102 Fahrenheit in the emergency room, CT abdomen showed 7 mm stone in left ureter, patient is admitted to medical service for acute pyelonephritis with left hydronephrosis, urologist is consulted by ER physician, Dr. Mena Goes is going to the emergency ureteral stent for this patient tonight.  Patient is n.p.o., started on aggressive hydration.  Receiving IV antibiotics.  Patient says that she has fullness in the left flank but otherwise denies any complaints.no  Nausea.  PAST MEDICAL HISTORY:   Past Medical History:  Diagnosis Date  . Anal condyloma   . Arthritis   . Chronic diarrhea    S/P  CHOLECYSTECTOMY  . GERD (gastroesophageal reflux disease)    WATCHES DIET  . History of basal cell carcinoma excision    X2   FROM  NOSE  . History of cervical cancer    DX  1989--  S/P  TAH WITH BSO IN 1999  . History of colon polyps   . History of kidney stones   . Renal calculus, left    2011    PAST SURGICAL HISTOIRY:   Past Surgical History:  Procedure Laterality Date  . ABDOMINAL HYSTERECTOMY    . CERVICAL CONIZATION W/BX  1998  . CHOLECYSTECTOMY N/A 12/29/2013   Procedure: LAPAROSCOPIC CHOLECYSTECTOMY;  Surgeon: Almond Lint, MD;  Location: [facility];   Service: General;  Laterality: N/A;  . COLONOSCOPY W/ POLYPECTOMY  X2   LAST ONE  NOV  2014  . EXTRACORPOREAL SHOCK WAVE LITHOTRIPSY  1999  . LASER ABLATION CONDOLAMATA N/A 06/16/2014   Procedure: ANAL EXAM UNDER ANESTHESIA,  LASER ABLATION OF ANAL CONDOLAMATA;  Surgeon: Romie Levee, MD;  Location: [facility];  Service: General;  Laterality: N/A;  . MOHS SURGERY  X2  LAST ONE 2011  . TOTAL ABDOMINAL HYSTERECTOMY W/ BILATERAL SALPINGOOPHORECTOMY  1999    SOCIAL HISTORY:   Social History   Tobacco Use  . Smoking status: Former Smoker    Packs/day: 1.00    Years: 38.00    Pack years: 38.00    Types: Cigarettes    Last attempt to quit: 01/01/2012    Years since quitting: 6.6  . Smokeless tobacco: Never Used  Substance Use Topics  . Alcohol use: Yes    Comment: occasionally    FAMILY HISTORY:   Family History  Problem Relation Age of Onset  . Cancer Mother        uterus  . Cancer Paternal Aunt        breast    DRUG ALLERGIES:   Allergies  Allergen Reactions  . Lactose Intolerance (Gi)   . Penicillins Itching    Has patient had a PCN reaction causing immediate rash, facial/tongue/throat swelling, SOB or lightheadedness  with hypotension: No Has patient had a PCN reaction causing severe rash involving mucus membranes or skin necrosis: No Has patient had a PCN reaction that required hospitalization: No Has patient had a PCN reaction occurring within the last 10 years: No If all of the above answers are "NO", then may proceed with Cephalosporin use.     REVIEW OF SYSTEMS:  CONSTITUTIONAL: High fever, left flank pain, chills.   EYES: No blurred or double vision.  EARS, NOSE, AND THROAT: No tinnitus or ear pain.  RESPIRATORY: No cough, shortness of breath, wheezing or hemoptysis.  CARDIOVASCULAR: No chest pain, orthopnea, edema.  GASTROINTESTINAL: No nausea, vomiting, diarrhea or abdominal pain.  GENITOURINARY: No dysuria, hematuria.  ENDOCRINE: No  polyuria, nocturia,  HEMATOLOGY: No anemia, easy bruising or bleeding SKIN: No rash or lesion. MUSCULOSKELETAL: No joint pain or arthritis.   NEUROLOGIC: No tingling, numbness, weakness.  PSYCHIATRY: No anxiety or depression.   MEDICATIONS AT HOME:   Prior to Admission medications   Medication Sig Start Date End Date Taking? Authorizing Provider  acetaminophen (TYLENOL) 500 MG tablet Take 500 mg by mouth every 6 (six) hours as needed for mild pain or moderate pain.   Yes [provider]      VITAL SIGNS:  Blood pressure (!) 97/52, pulse 71, temperature 99.7 F (37.6 C), temperature source Oral, resp. rate 20, height 5\' 6"  (1.676 m), weight 49.9 kg, SpO2 99 %.  PHYSICAL EXAMINATION:  GENERAL:  62 y.o.-year-old patient lying in the bed with no acute distress.  EYES: Pupils equal, round, reactive to light accommodation. No scleral icterus. Extraocular muscles intact.  HEENT: Head atraumatic, normocephalic. Oropharynx and nasopharynx clear.  NECK:  Supple, no jugular venous distention. No thyroid enlargement, no tenderness.  LUNGS: Normal breath sounds bilaterally, no wheezing, rales,rhonchi or crepitation. No use of accessory muscles of respiration.  CARDIOVASCULAR: S1, S2 normal. No murmurs, rubs, or gallops.  ABDOMEN: Soft, nontender, nondistended. Bowel sounds present. No organomegaly or mass.  Slight left flank tenderness  Present,. EXTREMITIES: No pedal edema, cyanosis, or clubbing.  NEUROLOGIC: Cranial nerves II through XII are intact. Muscle strength 5/5 in all extremities. Sensation intact. Gait not checked.  PSYCHIATRIC: The patient is alert and oriented x 3.  SKIN: No obvious rash, lesion, or ulcer.   LABORATORY PANEL:   CBC Recent Labs  Lab 08/08/18 1419  WBC 13.3*  HGB 10.9*  HCT 30.2*  PLT 231   ------------------------------------------------------------------------------------------------------------------  Chemistries  Recent Labs  Lab  08/08/18 1419  NA 137  K 3.6  CL 103  CO2 25  GLUCOSE 104*  BUN 13  CREATININE 0.86  CALCIUM 8.8*   ------------------------------------------------------------------------------------------------------------------  Cardiac Enzymes No results for input(s): TROPONINI in the last 168 hours. ------------------------------------------------------------------------------------------------------------------  RADIOLOGY:  Ct Abdomen Pelvis W Contrast  Result Date: 08/08/2018 CLINICAL DATA:  Pt sent here from [facility] with left flank pain, pressure in her bladder, no burning with urination, states she has an UTI, states it does not feel like kidney stones. EXAM: CT ABDOMEN AND PELVIS WITH CONTRAST TECHNIQUE: Multidetector CT imaging of the abdomen and pelvis was performed using the standard protocol following bolus administration of intravenous contrast. CONTRAST:  OMNIPAQUE IOHEXOL 300 MG/ML  SOLN COMPARISON:  None. FINDINGS: Lower chest: Clear lung bases.  Heart normal in size. Hepatobiliary: No focal liver abnormality is seen. Status post cholecystectomy. No biliary dilatation. Pancreas: Unremarkable. No pancreatic ductal dilatation or surrounding inflammatory changes. Spleen: Normal in size without focal abnormality. Adrenals/Urinary Tract: No adrenal  masses. Mild to moderate left hydronephrosis. This is due to a 7 mm stone in the proximal left ureter just below the ureteropelvic junction. There is another left-sided stone at the level of the pelvic brim that is likely a gonadal vein phlebolith, but a and additional ureteral stone is not excluded. No other evidence of a ureteral stone. No right hydronephrosis. There are no intrarenal stones. There is subtle relative decreased enhancement in portions of the mid and upper pole the left kidney on the delayed sequence. Consider superimposed pyelonephritis given the history of UTI. Bladder is mostly decompressed but otherwise unremarkable. Stomach/Bowel:  Stomach is within normal limits. Appendix appears normal. No evidence of bowel wall thickening, distention, or inflammatory changes. Vascular/Lymphatic: Aortic atherosclerosis. No aneurysm. Prominent left periaortic retroperitoneal lymph nodes at the level of the left kidney, largest measuring 9 mm in short axis Reproductive: Status post hysterectomy. No adnexal masses. Other: No abdominal wall hernia or abnormality. No abdominopelvic ascites. Musculoskeletal: No fracture or acute finding. No osteoblastic or osteolytic lesions. IMPRESSION: 1. 7 mm stone in the proximal left ureter just below the ureteropelvic junction causes mild to moderate left hydronephrosis. There may be an additional smaller stone at the level of the pelvic brim versus a canal vein phlebolith, the latter suspected. 2. Areas of relative decreased enhancement on the delayed sequence along the posterior aspect of the mid to upper left kidney. This is suspicious for pyelonephritis. No evidence of an abscess. 3. No other acute abnormality within the abdomen or pelvis. 4. Aortic atherosclerosis. Electronically Signed   By: Amie Portlandavid  Ormond M.D.   On: 08/08/2018 17:56    EKG:  No orders found for this or any previous visit.  IMPRESSION AND PLAN:   62 year old female patient with  Sepsis with evidence of hypotension, high fever, leukocytosis secondary to acute pyelonephritis, 7 mm left ureteral stone with moderate hydronephrosis of the left kidney: Patient is admitted to medical service, started on aggressive hydration, IV antibiotics, emergency urological consult for left ureteral stent placement, Dr. Mena GoesEskridge is contacted. Spoke with pt, follow blood cultures, urine cultures. #2. GI, DVT prophylaxis   All the records are reviewed and case discussed with ED provider. Management plans discussed with the patient, family and they are in agreement.  CODE STATUS: full  TOTAL TIME TAKING CARE OF THIS PATIENT: 55 minutes.    Katha HammingSnehalatha  [place] Dalporto M.D on 08/08/2018 at 7:21 PM  Between 7am to 6pm - Pager - [phone]  After 6pm go to [email or web address] - password [email or web address]  Fabio Neighbors[facility]  Office  [phone]  CC: Primary care physician; Ralene OkMoreira, Roy, MD  Note: This dictation was prepared with Dragon dictation along with smaller phrase technology. Any transcriptional errors that result from this process are unintentional.

## 2018-08-08 NOTE — Anesthesia Post-op Follow-up Note (Signed)
Anesthesia QCDR form completed.        

## 2018-08-08 NOTE — Op Note (Signed)
Preoperative diagnosis: Left ureteral stone, urinary tract infection Postoperative diagnosis: Same left ureteral stone with obstruction, urinary tract infection  Procedure: Cystoscopy with left retrograde pyelogram and left ureteral stent placement  Surgeon: Eskridge  Anesthesia: General  Indication for procedure: 62-year-old female who presented to the emergency department with left and right flank pain and was found to have a 7 mm left proximal ureteral stone.  She was brought for urgent stent.  Findings: Cystoscopy was unremarkable with no stone or foreign body in the bladder.  On scout imaging the collecting system on the left contained a delayed nephrogram with the dilated collecting system and contrast going through the renal pelvis and into the proximal ureter and then reaching an abrupt obstruction at the stone.  Left retrograde pyelogram-this outlined a single ureter single collecting system unit with a normal ureter up to the stone but there was a narrow wisp of contrast that met the contrast at a transition point to a dilated proximal ureter and collecting system.  Description of procedure: After consent was obtained the patient was brought to the operating room.  After adequate anesthesia she was placed in lithotomy position and prepped and draped in the usual sterile fashion.  A timeout was performed to confirm the patient and procedure.  The cystoscope was passed per urethra and the bladder drained and refilled.  The left ureteral orifice was cannulated with a 5 French open-ended catheter and retrograde injection of contrast was performed.  A sensor wire was then advanced but it buckled at the stone would not pass.  Therefore the 5 French open-ended catheter was advanced into the ureter which braced the wire which allowed the wire to pass into the collecting system.  The system began to drain and the urine contained only mild debris.  The collecting system dilation decreased in the  calyces became sharp.  A 6 x 26 cm stent was advanced over the wire with a good coil seen in the kidney and a good coil in the bladder.  The stent appeared to be draining well.  The scope was removed and a 16 French Foley was placed for max drainage overnight.  The patient was awakened and taken to the recovery room in stable condition.  Complications: None  Blood loss: Minimal  Specimens: None  Drains: Left 6 x 26 cm ureteral stent, 16 French Foley 

## 2018-08-08 NOTE — Transfer of Care (Signed)
   Immediate Anesthesia Transfer of Care Note  Patient: Brittney Santos  Procedure(s) Performed: CYSTOSCOPY WITH RETROGRADE PYELOGRAM, URETEROSCOPY AND STENT PLACEMENT (Left )  Patient Location: PACU  Anesthesia Type:General  Level of Consciousness: awake and sedated  Airway & Oxygen Therapy: Patient Spontanous Breathing and Patient connected to face mask oxygen  Post-op Assessment: Report given to RN and Post -op Vital signs reviewed and stable  Post vital signs: Reviewed and stable  Last Vitals:  Vitals Value Taken Time  BP    Temp    Pulse    Resp    SpO2      Last Pain:  Vitals:   08/08/18 2022  TempSrc: Oral  PainSc:          Complications: No apparent anesthesia complications

## 2018-08-08 NOTE — Anesthesia Preprocedure Evaluation (Signed)
Anesthesia Evaluation  Patient identified by MRN, date of birth, ID band Patient awake    Reviewed: Allergy & Precautions, H&P , NPO status , Patient's Chart, lab work & pertinent test results, reviewed documented beta blocker date and time   History of Anesthesia Complications Negative for: history of anesthetic complications  Airway Mallampati: I  TM Distance: >3 FB Neck ROM: full    Dental  (+) Edentulous Upper, Edentulous Lower   Pulmonary neg pulmonary ROS, former smoker,    Pulmonary exam normal        Cardiovascular Exercise Tolerance: Good negative cardio ROS       Neuro/Psych negative neurological ROS  negative psych ROS   GI/Hepatic Neg liver ROS, GERD  ,  Endo/Other  negative endocrine ROS  Renal/GU Renal disease  negative genitourinary   Musculoskeletal   Abdominal   Peds  Hematology negative hematology ROS (+)   Anesthesia Other Findings Past Medical History: No date: Anal condyloma No date: Arthritis No date: Chronic diarrhea     Comment:  S/P  CHOLECYSTECTOMY No date: GERD (gastroesophageal reflux disease)     Comment:  WATCHES DIET No date: History of basal cell carcinoma excision     Comment:  X2   FROM  NOSE No date: History of cervical cancer     Comment:  DX  1989--  S/P  TAH WITH BSO IN 1999 No date: History of colon polyps No date: History of kidney stones No date: Renal calculus, left     Comment:  2011   Reproductive/Obstetrics negative OB ROS                             Anesthesia Physical Anesthesia Plan  ASA: II  Anesthesia Plan: General   Post-op Pain Management:    Induction: Intravenous, Rapid sequence and Cricoid pressure planned  PONV Risk Score and Plan: 3 and Ondansetron, Dexamethasone and Midazolam  Airway Management Planned: Oral ETT  Additional Equipment:   Intra-op Plan:   Post-operative Plan: Extubation in OR  Informed  Consent: I have reviewed the patients History and Physical, chart, labs and discussed the procedure including the risks, benefits and alternatives for the proposed anesthesia with the patient or authorized representative who has indicated his/her understanding and acceptance.   Dental Advisory Given  Plan Discussed with: Anesthesiologist, CRNA and Surgeon  Anesthesia Plan Comments:         Anesthesia Quick Evaluation

## 2018-08-08 NOTE — Progress Notes (Signed)
Pharmacy Antibiotic Note  Brittney Santos is a 62 y.o. female admitted on 08/08/2018 with pyelonephritis.  Pharmacy has been consulted for Levaquin dosing.She has a h/o pyelonephritis complicated by renal abscess in 2016 and a h/o kidney stones. She has not been on any recent antibiotics.  Plan: Levaquin 750mg  IV every 24 hours  Height: 5\' 6"  (167.6 cm) Weight: 110 lb (49.9 kg) IBW/kg (Calculated) : 59.3  Temp (24hrs), Avg:101.4 F (38.6 C), Min:100.2 F (37.9 C), Max:102.6 F (39.2 C)  Recent Labs  Lab 08/08/18 1419  WBC 13.3*  CREATININE 0.86  LATICACIDVEN 1.5    Estimated Creatinine Clearance: 54.1 mL/min (by C-G formula based on SCr of 0.86 mg/dL).    Allergies  Allergen Reactions  . Lactose Intolerance (Gi)   . Penicillins Itching    Antimicrobials this admission: Ceftriaxone 8/9 x1 Levaquin 8/9 >>   Microbiology results: 8/9 BCx: pending 8/9 UCx: pending  Thank you for allowing pharmacy to be a part of this patient's care.  Lowella Bandyodney D Tashica Provencio, PharmD 08/08/2018 6:29 PM

## 2018-08-09 ENCOUNTER — Other Ambulatory Visit: Payer: Self-pay

## 2018-08-09 LAB — BASIC METABOLIC PANEL
ANION GAP: 5 (ref 5–15)
BUN: 13 mg/dL (ref 8–23)
CALCIUM: 8.2 mg/dL — AB (ref 8.9–10.3)
CO2: 25 mmol/L (ref 22–32)
CREATININE: 0.76 mg/dL (ref 0.44–1.00)
Chloride: 109 mmol/L (ref 98–111)
GFR calc non Af Amer: >60 mL/min (ref >60)
Glucose, Bld: 216 mg/dL — ABNORMAL HIGH (ref 70–99)
Potassium: 3.6 mmol/L (ref 3.5–5.1)
Sodium: 139 mmol/L (ref 135–145)

## 2018-08-09 LAB — CBC
HCT: 27.5 % — ABNORMAL LOW (ref 35.0–47.0)
Hemoglobin: 9.8 g/dL — ABNORMAL LOW (ref 12.0–16.0)
MCH: 31.5 pg (ref 26.0–34.0)
MCHC: 35.6 g/dL (ref 32.0–36.0)
MCV: 88.5 fL (ref 80.0–100.0)
PLATELETS: 190 10*3/uL (ref 150–440)
RBC: 3.1 MIL/uL — AB (ref 3.80–5.20)
RDW: 12.4 % (ref 11.5–14.5)
WBC: 9 10*3/uL (ref 3.6–11.0)

## 2018-08-09 LAB — HEMOGLOBIN A1C
Hgb A1c MFr Bld: 5.2 % (ref 4.8–5.6)
Mean Plasma Glucose: 102.54 mg/dL

## 2018-08-09 LAB — GLUCOSE, CAPILLARY: GLUCOSE-CAPILLARY: 168 mg/dL — AB (ref 70–99)

## 2018-08-09 MED ORDER — MENTHOL 3 MG MT LOZG: 1.0000 | LOZENGE | OROMUCOSAL | Status: DC | PRN

## 2018-08-09 MED ORDER — TRAMADOL HCL 50 MG PO TABS
50.0000 mg | ORAL_TABLET | Freq: Four times a day (QID) | ORAL | Status: DC | PRN
  Administered 2018-08-09 (×2): 50 mg via ORAL
  Filled 2018-08-09 (×2): qty 1

## 2018-08-09 MED ORDER — SODIUM CHLORIDE 0.9 % IV BOLUS
1000.0000 mL | Freq: Once | INTRAVENOUS | Status: AC
  Administered 2018-08-09: 1000 mL via INTRAVENOUS

## 2018-08-09 MED ORDER — SODIUM CHLORIDE 0.9 % IV BOLUS
500.0000 mL | Freq: Once | INTRAVENOUS | Status: AC
  Administered 2018-08-09: 500 mL via INTRAVENOUS

## 2018-08-09 MED ORDER — MENTHOL 3 MG MT LOZG
1.0000 | LOZENGE | OROMUCOSAL | Status: DC | PRN
  Administered 2018-08-09: 3 mg via ORAL
  Filled 2018-08-09: qty 9

## 2018-08-09 MED ORDER — CEPASTAT 14.5 MG MT LOZG
1.0000 | LOZENGE | Freq: Three times a day (TID) | OROMUCOSAL | Status: DC | PRN
  Filled 2018-08-09 (×3): qty 9

## 2018-08-09 NOTE — Progress Notes (Signed)
Pt hypotensive over the night. Patient has received 2 liter boluses and BP is 84/44. Notified MD and he ordered to continue to assess for change in mentation and weakness. Patient alert and oriented. Will continue to monitor patient and vitals.

## 2018-08-09 NOTE — Progress Notes (Signed)
Family Meeting Note  Advance Directive:yes  Today a meeting took place with the Patient.    The following clinical team members were present during this meeting:MD  The following were discussed:Patient's diagnosis: Sepsis, acute pyelonephritis, moderate hydronephrosis, left ureteral stone, status post left ureteral stent placement by urology, treatment plan of care with IV fluids, antibiotics was discussed in detail with the patient.  Other comorbidities as documented below are also discussed with the patient.  She verbalized understanding of the plan   Anal condyloma    . Arthritis   . Chronic diarrhea    S/P  CHOLECYSTECTOMY  . GERD (gastroesophageal reflux disease)    WATCHES DIET  . History of basal cell carcinoma excision    X2   FROM  NOSE  . History of cervical cancer    DX  1989--  S/P  TAH WITH BSO IN 1999  . History of colon polyps   . History of kidney stones   . Renal calculus, left    2011   Patient's progosis: > 12 months and Goals for treatment: Full Code, Sister Letitia NeriSherry Santos is a healthcare power of attorney  Additional follow-up to be provided: Hospitalist and urologist   Time spent during discussion:17 min  Ramonita LabAruna Bellamie Turney, MD

## 2018-08-09 NOTE — Progress Notes (Signed)
[facility] [place] NAME: Brittney Santos    MR#:  [id number]  DATE OF BIRTH:  [birth year]  SUBJECTIVE:  CHIEF COMPLAINT: Resting comfortably reporting left flank pain otherwise no dizziness.  Baseline blood pressure 90/50  REVIEW OF SYSTEMS:  CONSTITUTIONAL: No fever, fatigue or weakness.  EYES: No blurred or double vision.  EARS, NOSE, AND THROAT: No tinnitus or ear pain.  RESPIRATORY: No cough, shortness of breath, wheezing or hemoptysis.  CARDIOVASCULAR: No chest pain, orthopnea, edema.  GASTROINTESTINAL: No nausea, vomiting, diarrhea or abdominal pain.  Reporting left flank pain GENITOURINARY: No dysuria, hematuria.  ENDOCRINE: No polyuria, nocturia,  HEMATOLOGY: No anemia, easy bruising or bleeding SKIN: No rash or lesion. MUSCULOSKELETAL: No joint pain or arthritis.   NEUROLOGIC: No tingling, numbness, weakness.  PSYCHIATRY: No anxiety or depression.   DRUG ALLERGIES:   Allergies  Allergen Reactions  . Lactose Intolerance (Gi)   . Penicillins Itching    Has patient had a PCN reaction causing immediate rash, facial/tongue/throat swelling, SOB or lightheadedness with hypotension: No Has patient had a PCN reaction causing severe rash involving mucus membranes or skin necrosis: No Has patient had a PCN reaction that required hospitalization: No Has patient had a PCN reaction occurring within the last 10 years: No If all of the above answers are "NO", then may proceed with Cephalosporin use.     VITALS:  Blood pressure (!) 84/44, pulse (!) 55, temperature 98.1 F (36.7 C), temperature source Oral, resp. rate 20, height _0  (1.676 m), weight 50 kg, SpO2 97 %.  PHYSICAL EXAMINATION:  GENERAL:  62 y.o.-year-old patient lying in the bed with no acute distress.  EYES: Pupils equal, round, reactive to light and accommodation. No scleral icterus. Extraocular muscles intact.  HEENT: Head atraumatic, normocephalic. Oropharynx  and nasopharynx clear.  NECK:  Supple, no jugular venous distention. No thyroid enlargement, no tenderness.  LUNGS: Normal breath sounds bilaterally, no wheezing, rales,rhonchi or crepitation. No use of accessory muscles of respiration.  CARDIOVASCULAR: S1, S2 normal. No murmurs, rubs, or gallops.  ABDOMEN: Soft, nontender, nondistended. Bowel sounds present.  Left flank is tender EXTREMITIES: No pedal edema, cyanosis, or clubbing.  NEUROLOGIC: Cranial nerves II through XII are intact. Sensation intact. Gait not checked.  PSYCHIATRIC: The patient is alert and oriented x 3.  SKIN: No obvious rash, lesion, or ulcer.    LABORATORY PANEL:   CBC Recent Labs  Lab 08/09/18 0428  WBC 9.0  HGB 9.8*  HCT 27.5*  PLT 190   ------------------------------------------------------------------------------------------------------------------  Chemistries  Recent Labs  Lab 08/09/18 0428  NA 139  K 3.6  CL 109  CO2 25  GLUCOSE 216*  BUN 13  CREATININE 0.76  CALCIUM 8.2*   ------------------------------------------------------------------------------------------------------------------  Cardiac Enzymes No results for input(s): TROPONINI in the last 168 hours. ------------------------------------------------------------------------------------------------------------------  RADIOLOGY:  Ct Abdomen Pelvis W Contrast  Result Date: 08/08/2018 CLINICAL DATA:  Pt sent here from [facility] with left flank pain, pressure in her bladder, no burning with urination, states she has an UTI, states it does not feel like kidney stones. EXAM: CT ABDOMEN AND PELVIS WITH CONTRAST TECHNIQUE: Multidetector CT imaging of the abdomen and pelvis was performed using the standard protocol following bolus administration of intravenous contrast. CONTRAST:  13m OMNIPAQUE IOHEXOL 300 MG/ML  SOLN COMPARISON:  None. FINDINGS: Lower chest: Clear lung bases.  Heart normal in size. Hepatobiliary: No focal liver abnormality is seen.  Status post cholecystectomy. No biliary dilatation. Pancreas:  Unremarkable. No pancreatic ductal dilatation or surrounding inflammatory changes. Spleen: Normal in size without focal abnormality. Adrenals/Urinary Tract: No adrenal masses. Mild to moderate left hydronephrosis. This is due to a 7 mm stone in the proximal left ureter just below the ureteropelvic junction. There is another left-sided stone at the level of the pelvic brim that is likely a gonadal vein phlebolith, but a and additional ureteral stone is not excluded. No other evidence of a ureteral stone. No right hydronephrosis. There are no intrarenal stones. There is subtle relative decreased enhancement in portions of the mid and upper pole the left kidney on the delayed sequence. Consider superimposed pyelonephritis given the history of UTI. Bladder is mostly decompressed but otherwise unremarkable. Stomach/Bowel: Stomach is within normal limits. Appendix appears normal. No evidence of bowel wall thickening, distention, or inflammatory changes. Vascular/Lymphatic: Aortic atherosclerosis. No aneurysm. Prominent left periaortic retroperitoneal lymph nodes at the level of the left kidney, largest measuring 9 mm in short axis Reproductive: Status post hysterectomy. No adnexal masses. Other: No abdominal wall hernia or abnormality. No abdominopelvic ascites. Musculoskeletal: No fracture or acute finding. No osteoblastic or osteolytic lesions. IMPRESSION: 1. 7 mm stone in the proximal left ureter just below the ureteropelvic junction causes mild to moderate left hydronephrosis. There may be an additional smaller stone at the level of the pelvic brim versus a canal vein phlebolith, the latter suspected. 2. Areas of relative decreased enhancement on the delayed sequence along the posterior aspect of the mid to upper left kidney. This is suspicious for pyelonephritis. No evidence of an abscess. 3. No other acute abnormality within the abdomen or pelvis. 4.  Aortic atherosclerosis. Electronically Signed   By: Lajean Manes M.D.   On: 08/08/2018 17:56    EKG:  No orders found for this or any previous visit.  ASSESSMENT AND PLAN:     #Sepsis secondary to acute pyelonephritis Patient met septic criteria at the time of admission with hypotension, fever, leukocytosis Blood cultures and urine cultures were ordered which are pending. IV fluids and IV levofloxacin as the patient is allergic to penicillin  #Left ureteral stone with moderate hydronephrosis of the left kidney Status post cystoscopy and left ureteral stent placement by Dr. Junious Silk on 08/08/2018 Pain management as needed  #Hypotension-from underlying sepsis Continue IV fluids. Patient reports her baseline blood pressure is 90 /50  # GERD -pepcid  # history of cervical cancer status post total abdominal hysterectomy with bilateral salpingo-nephrectomy   All the records are reviewed and case discussed with Care Management/Social Workerr. Management plans discussed with the patient, family and they are in agreement.  CODE STATUS: fc , Sister Tressia Danas is a healthcare POA  TOTAL TIME TAKING CARE OF THIS PATIENT: 33mnutes.   POSSIBLE D/C IN 1-2 DAYS, DEPENDING ON CLINICAL CONDITION.  Note: This dictation was prepared with Dragon dictation along with smaller phrase technology. Any transcriptional errors that result from this process are unintentional.   ANicholes MangoM.D on 08/09/2018 at 1:38 PM  Between 7am to 6pm - Pager - [phone] 6pm go to [email or web address] - password [email or web address] [facility]Hospital[facility]  Office  [phone] CC: Primary care physician; MJilda Panda MD

## 2018-08-09 NOTE — Progress Notes (Signed)
Called Dr. Amado CoeGouru with 89/45 BP. 500 mL NS bolus ordered.

## 2018-08-10 DIAGNOSIS — N132 Hydronephrosis with renal and ureteral calculous obstruction: Secondary | ICD-10-CM

## 2018-08-10 DIAGNOSIS — R110 Nausea: Secondary | ICD-10-CM

## 2018-08-10 LAB — CBC
HEMATOCRIT: 24.5 % — AB (ref 35.0–47.0)
HEMOGLOBIN: 8.6 g/dL — AB (ref 12.0–16.0)
MCH: 31.2 pg (ref 26.0–34.0)
MCHC: 34.9 g/dL (ref 32.0–36.0)
MCV: 89.4 fL (ref 80.0–100.0)
Platelets: 182 10*3/uL (ref 150–440)
RBC: 2.74 MIL/uL — ABNORMAL LOW (ref 3.80–5.20)
RDW: 12.3 % (ref 11.5–14.5)
WBC: 7.4 10*3/uL (ref 3.6–11.0)

## 2018-08-10 LAB — BASIC METABOLIC PANEL
Anion gap: 6 (ref 5–15)
BUN: 10 mg/dL (ref 8–23)
CO2: 24 mmol/L (ref 22–32)
Calcium: 8.2 mg/dL — ABNORMAL LOW (ref 8.9–10.3)
Chloride: 113 mmol/L — ABNORMAL HIGH (ref 98–111)
Creatinine, Ser: 0.67 mg/dL (ref 0.44–1.00)
GFR calc Af Amer: >60 mL/min (ref >60)
GFR calc non Af Amer: >60 mL/min (ref >60)
Glucose, Bld: 122 mg/dL — ABNORMAL HIGH (ref 70–99)
Potassium: 3.6 mmol/L (ref 3.5–5.1)
Sodium: 143 mmol/L (ref 135–145)

## 2018-08-10 LAB — GLUCOSE, CAPILLARY: Glucose-Capillary: 109 mg/dL — ABNORMAL HIGH (ref 70–99)

## 2018-08-10 LAB — HIV ANTIBODY (ROUTINE TESTING W REFLEX): HIV Screen 4th Generation wRfx: NONREACTIVE

## 2018-08-10 MED ORDER — PROMETHAZINE HCL 25 MG PO TABS
12.5000 mg | ORAL_TABLET | Freq: Four times a day (QID) | ORAL | Status: DC | PRN
  Administered 2018-08-10: 12.5 mg via ORAL
  Filled 2018-08-10: qty 1

## 2018-08-10 MED ORDER — PROMETHAZINE HCL 25 MG/ML IJ SOLN
12.5000 mg | Freq: Four times a day (QID) | INTRAMUSCULAR | Status: DC | PRN
  Filled 2018-08-10: qty 1

## 2018-08-10 MED ORDER — ALUM & MAG HYDROXIDE-SIMETH 200-200-20 MG/5ML PO SUSP
30.0000 mL | Freq: Four times a day (QID) | ORAL | Status: DC | PRN
  Administered 2018-08-10: 30 mL via ORAL
  Filled 2018-08-10: qty 30

## 2018-08-10 NOTE — Progress Notes (Signed)
2 Days Post-Op Subjective: Patient reports bouts of persistent nausea and crampy pain in the left flank and lower quadrant.  No pain with voiding.  She is voiding without difficulty.  Objective: Vital signs in last 24 hours: Temp:  [98.1 F (36.7 C)-98.3 F (36.8 C)] 98.3 F (36.8 C) (08/11 1341) Pulse Rate:  [58-59] 58 (08/11 1341) Resp:  [16-20] 20 (08/11 1341) BP: (91-109)/(54-62) 104/54 (08/11 1341) SpO2:  [96 %-98 %] 96 % (08/11 1341) Weight:  [50.1 kg] 50.1 kg (08/11 0432)  Intake/Output from previous day: 08/10 0701 - 08/11 0700 In: 2275.3 [I.V.:1625.3; IV Piggyback:650] Out: 1350 [Urine:1350] Intake/Output this shift: Total I/O In: 2762 [P.O.:480; I.V.:2282] Out: -   Physical Exam:  No acute distress Watching TV Abdomen soft and nontender  Lab Results: Recent Labs    08/08/18 1419 08/09/18 0428 08/10/18 0359  HGB 10.9* 9.8* 8.6*  HCT 30.2* 27.5* 24.5*   BMET Recent Labs    08/09/18 0428 08/10/18 0359  NA 139 143  K 3.6 3.6  CL 109 113*  CO2 25 24  GLUCOSE 216* 122*  BUN 13 10  CREATININE 0.76 0.67  CALCIUM 8.2* 8.2*   No results for input(s): LABPT, INR in the last 72 hours. No results for input(s): LABURIN in the last 72 hours. Results for orders placed or performed during the hospital encounter of 08/08/18  Blood culture (routine x 2)     Status: None (Preliminary result)   Collection Time: 08/08/18  2:19 PM  Result Value Ref Range Status   Specimen Description BLOOD BLOOD RIGHT FOREARM  Final   Special Requests   Final    BLOOD Blood Culture results may not be optimal due to an inadequate volume of blood received in culture bottles   Culture   Final    NO GROWTH 2 DAYS Performed at [facility], [address], [place], [place] [address]    Report Status PENDING  Incomplete  Urine Culture     Status: Abnormal (Preliminary result)   Collection Time: 08/08/18  5:19 PM  Result Value Ref Range Status   Specimen Description   Final     URINE, RANDOM Performed at [facility], [address] [place], [place] [address]    Special Requests   Final    NONE Performed at [facility], [address] [place], [place] [address]    Culture (A)  Final    >=100,000 COLONIES/mL ESCHERICHIA COLI SUSCEPTIBILITIES TO FOLLOW Performed at [facility], [address], [place], [place] [address]    Report Status PENDING  Incomplete  Blood culture (routine x 2)     Status: None (Preliminary result)   Collection Time: 08/08/18  8:31 PM  Result Value Ref Range Status   Specimen Description BLOOD LEFT ANTECUBITAL  Final   Special Requests   Final    BOTTLES DRAWN AEROBIC AND ANAEROBIC Blood Culture results may not be optimal due to an excessive volume of blood received in culture bottles   Culture   Final    NO GROWTH 2 DAYS Performed at [facility], [address] [place], [place] [address]    Report Status PENDING  Incomplete    Studies/Results: Ct Abdomen Pelvis W Contrast  Result Date: 08/08/2018 CLINICAL DATA:  Pt sent here from [facility] with left flank pain, pressure in her bladder, no burning with urination, states she has an UTI, states it does not feel like kidney stones. EXAM: CT ABDOMEN AND  PELVIS WITH CONTRAST TECHNIQUE: Multidetector CT imaging of the abdomen and pelvis was performed using the standard protocol following bolus administration of intravenous contrast. CONTRAST:  100mL OMNIPAQUE IOHEXOL 300 MG/ML  SOLN COMPARISON:  None. FINDINGS: Lower chest: Clear lung bases.  Heart normal in size. Hepatobiliary: No focal liver abnormality is seen. Status post cholecystectomy. No biliary dilatation. Pancreas: Unremarkable. No pancreatic ductal dilatation or surrounding inflammatory changes. Spleen: Normal in size without focal abnormality. Adrenals/Urinary Tract: No adrenal masses. Mild to moderate left hydronephrosis. This is due to a 7 mm stone in the proximal left ureter just below the  ureteropelvic junction. There is another left-sided stone at the level of the pelvic brim that is likely a gonadal vein phlebolith, but a and additional ureteral stone is not excluded. No other evidence of a ureteral stone. No right hydronephrosis. There are no intrarenal stones. There is subtle relative decreased enhancement in portions of the mid and upper pole the left kidney on the delayed sequence. Consider superimposed pyelonephritis given the history of UTI. Bladder is mostly decompressed but otherwise unremarkable. Stomach/Bowel: Stomach is within normal limits. Appendix appears normal. No evidence of bowel wall thickening, distention, or inflammatory changes. Vascular/Lymphatic: Aortic atherosclerosis. No aneurysm. Prominent left periaortic retroperitoneal lymph nodes at the level of the left kidney, largest measuring 9 mm in short axis Reproductive: Status post hysterectomy. No adnexal masses. Other: No abdominal wall hernia or abnormality. No abdominopelvic ascites. Musculoskeletal: No fracture or acute finding. No osteoblastic or osteolytic lesions. IMPRESSION: 1. 7 mm stone in the proximal left ureter just below the ureteropelvic junction causes mild to moderate left hydronephrosis. There may be an additional smaller stone at the level of the pelvic brim versus a canal vein phlebolith, the latter suspected. 2. Areas of relative decreased enhancement on the delayed sequence along the posterior aspect of the mid to upper left kidney. This is suspicious for pyelonephritis. No evidence of an abscess. 3. No other acute abnormality within the abdomen or pelvis. 4. Aortic atherosclerosis. Electronically Signed   By: Amie Portlandavid  Ormond M.D.   On: 08/08/2018 17:56    Assessment/Plan:  Left proximal stone- status post left ureteral stent  Urinary tract infection-urine culture growing E. coli sensitivities pending  I will sign off, I [facility] and reminded patient of  importance of follow-up.  Please send her out on 10 to 14 days of p.o. antibiotics.   LOS: 2 days   Jerilee FieldMatthew Angell Honse 08/10/2018, 2:30 PM

## 2018-08-10 NOTE — Progress Notes (Signed)
[place] [facility] [place] NAME: Brittney Santos    MR#:  [id number]  DATE OF BIRTH:  1956/04/24  SUBJECTIVE:  CHIEF COMPLAINT: Resting comfortably reporting left flank pain is getting better otherwise no dizziness.  Baseline blood pressure 90/50  REVIEW OF SYSTEMS:  CONSTITUTIONAL: No fever, fatigue or weakness.  EYES: No blurred or double vision.  EARS, NOSE, AND THROAT: No tinnitus or ear pain.  RESPIRATORY: No cough, shortness of breath, wheezing or hemoptysis.  CARDIOVASCULAR: No chest pain, orthopnea, edema.  GASTROINTESTINAL: No nausea, vomiting, diarrhea or abdominal pain.  Reporting left flank pain GENITOURINARY: No dysuria, hematuria.  ENDOCRINE: No polyuria, nocturia,  HEMATOLOGY: No anemia, easy bruising or bleeding SKIN: No rash or lesion. MUSCULOSKELETAL: No joint pain or arthritis.   NEUROLOGIC: No tingling, numbness, weakness.  PSYCHIATRY: No anxiety or depression.   DRUG ALLERGIES:   Allergies  Allergen Reactions  . Lactose Intolerance (Gi)   . Penicillins Itching    Has patient had a PCN reaction causing immediate rash, facial/tongue/throat swelling, SOB or lightheadedness with hypotension: No Has patient had a PCN reaction causing severe rash involving mucus membranes or skin necrosis: No Has patient had a PCN reaction that required hospitalization: No Has patient had a PCN reaction occurring within the last 10 years: No If all of the above answers are "NO", then may proceed with Cephalosporin use.     VITALS:  Blood pressure (!) 91/55, pulse (!) 59, temperature 98.1 F (36.7 C), temperature source Oral, resp. rate 16, height _0  (1.676 m), weight 50.1 kg, SpO2 96 %.  PHYSICAL EXAMINATION:  GENERAL:  62 y.o.-year-old patient lying in the bed with no acute distress.  EYES: Pupils equal, round, reactive to light and accommodation. No scleral icterus. Extraocular muscles intact.  HEENT: Head atraumatic,  normocephalic. Oropharynx and nasopharynx clear.  NECK:  Supple, no jugular venous distention. No thyroid enlargement, no tenderness.  LUNGS: Normal breath sounds bilaterally, no wheezing, rales,rhonchi or crepitation. No use of accessory muscles of respiration.  CARDIOVASCULAR: S1, S2 normal. No murmurs, rubs, or gallops.  ABDOMEN: Soft, nontender, nondistended. Bowel sounds present.  Left flank is minimally tender EXTREMITIES: No pedal edema, cyanosis, or clubbing.  NEUROLOGIC: Cranial nerves II through XII are intact. Sensation intact. Gait not checked.  PSYCHIATRIC: The patient is alert and oriented x 3.  SKIN: No obvious rash, lesion, or ulcer.    LABORATORY PANEL:   CBC Recent Labs  Lab 08/10/18 0359  WBC 7.4  HGB 8.6*  HCT 24.5*  PLT 182   ------------------------------------------------------------------------------------------------------------------  Chemistries  Recent Labs  Lab 08/10/18 0359  NA 143  K 3.6  CL 113*  CO2 24  GLUCOSE 122*  BUN 10  CREATININE 0.67  CALCIUM 8.2*   ------------------------------------------------------------------------------------------------------------------  Cardiac Enzymes No results for input(s): TROPONINI in the last 168 hours. ------------------------------------------------------------------------------------------------------------------  RADIOLOGY:  Ct Abdomen Pelvis W Contrast  Result Date: 08/08/2018 CLINICAL DATA:  Pt sent here from [facility] with left flank pain, pressure in her bladder, no burning with urination, states she has an UTI, states it does not feel like kidney stones. EXAM: CT ABDOMEN AND PELVIS WITH CONTRAST TECHNIQUE: Multidetector CT imaging of the abdomen and pelvis was performed using the standard protocol following bolus administration of intravenous contrast. CONTRAST:  170m OMNIPAQUE IOHEXOL 300 MG/ML  SOLN COMPARISON:  None. FINDINGS: Lower chest: Clear lung bases.  Heart normal in size. Hepatobiliary:  No focal liver abnormality is seen. Status post cholecystectomy.  No biliary dilatation. Pancreas: Unremarkable. No pancreatic ductal dilatation or surrounding inflammatory changes. Spleen: Normal in size without focal abnormality. Adrenals/Urinary Tract: No adrenal masses. Mild to moderate left hydronephrosis. This is due to a 7 mm stone in the proximal left ureter just below the ureteropelvic junction. There is another left-sided stone at the level of the pelvic brim that is likely a gonadal vein phlebolith, but a and additional ureteral stone is not excluded. No other evidence of a ureteral stone. No right hydronephrosis. There are no intrarenal stones. There is subtle relative decreased enhancement in portions of the mid and upper pole the left kidney on the delayed sequence. Consider superimposed pyelonephritis given the history of UTI. Bladder is mostly decompressed but otherwise unremarkable. Stomach/Bowel: Stomach is within normal limits. Appendix appears normal. No evidence of bowel wall thickening, distention, or inflammatory changes. Vascular/Lymphatic: Aortic atherosclerosis. No aneurysm. Prominent left periaortic retroperitoneal lymph nodes at the level of the left kidney, largest measuring 9 mm in short axis Reproductive: Status post hysterectomy. No adnexal masses. Other: No abdominal wall hernia or abnormality. No abdominopelvic ascites. Musculoskeletal: No fracture or acute finding. No osteoblastic or osteolytic lesions. IMPRESSION: 1. 7 mm stone in the proximal left ureter just below the ureteropelvic junction causes mild to moderate left hydronephrosis. There may be an additional smaller stone at the level of the pelvic brim versus a canal vein phlebolith, the latter suspected. 2. Areas of relative decreased enhancement on the delayed sequence along the posterior aspect of the mid to upper left kidney. This is suspicious for pyelonephritis. No evidence of an abscess. 3. No other acute abnormality  within the abdomen or pelvis. 4. Aortic atherosclerosis. Electronically Signed   By: Lajean Manes M.D.   On: 08/08/2018 17:56    EKG:  No orders found for this or any previous visit.  ASSESSMENT AND PLAN:     #Sepsis secondary to acute pyelonephritis Patient met septic criteria at the time of admission with hypotension, fever, leukocytosis Blood cultures are negative  Urine culture with E. coli sensitivities are pending IV fluids and IV levofloxacin as the patient is allergic to penicillin  #Left ureteral stone with moderate hydronephrosis of the left kidney Status post cystoscopy and left ureteral stent placement by Dr. Junious Silk on 08/08/2018 Pain management as needed  #Hypotension-from underlying sepsis Continue IV fluids. Patient reports her baseline blood pressure is 90 /50  # GERD -pepcid  # history of cervical cancer status post total abdominal hysterectomy with bilateral salpingo-nephrectomy   All the records are reviewed and case discussed with Care Management/Social Workerr. Management plans discussed with the patient, family and they are in agreement.  CODE STATUS: fc , Sister Tressia Danas is a healthcare POA  TOTAL TIME TAKING CARE OF THIS PATIENT: 34mnutes.   POSSIBLE D/C IN 1- DAYS, DEPENDING ON CLINICAL CONDITION.  Note: This dictation was prepared with Dragon dictation along with smaller phrase technology. Any transcriptional errors that result from this process are unintentional.   ANicholes MangoM.D on 08/10/2018 at 1:07 PM  Between 7am to 6pm - Pager - [phone] 6pm go to [email or web address] - password [email or web address] [facility] EEdmundsonHospitalists  Office  [phone] CC: Primary care physician; MJilda Panda MD

## 2018-08-11 LAB — URINE CULTURE: Culture: >=100000 — AB

## 2018-08-11 LAB — CBC
HEMATOCRIT: 25.4 % — AB (ref 35.0–47.0)
Hemoglobin: 9 g/dL — ABNORMAL LOW (ref 12.0–16.0)
MCH: 31.5 pg (ref 26.0–34.0)
MCHC: 35.4 g/dL (ref 32.0–36.0)
MCV: 89 fL (ref 80.0–100.0)
Platelets: 208 10*3/uL (ref 150–440)
RBC: 2.85 MIL/uL — AB (ref 3.80–5.20)
RDW: 12.4 % (ref 11.5–14.5)
WBC: 6.6 10*3/uL (ref 3.6–11.0)

## 2018-08-11 LAB — GLUCOSE, CAPILLARY: Glucose-Capillary: 104 mg/dL — ABNORMAL HIGH (ref 70–99)

## 2018-08-11 LAB — OCCULT BLOOD X 1 CARD TO LAB, STOOL: Fecal Occult Bld: NEGATIVE

## 2018-08-11 MED ORDER — CIPROFLOXACIN HCL 500 MG PO TABS: 500.0000 mg | ORAL_TABLET | Freq: Two times a day (BID) | ORAL | 0 refills | Status: DC

## 2018-08-11 MED ORDER — BUTALBITAL-APAP-CAFFEINE 50-325-40 MG PO TABS: 1.0000 | ORAL_TABLET | Freq: Three times a day (TID) | ORAL | 0 refills | Status: DC | PRN

## 2018-08-11 MED ORDER — ONDANSETRON HCL 4 MG PO TABS: 4.0000 mg | ORAL_TABLET | Freq: Four times a day (QID) | ORAL | 0 refills | Status: DC | PRN

## 2018-08-11 MED ORDER — CIPROFLOXACIN HCL 500 MG PO TABS: 500.0000 mg | ORAL_TABLET | Freq: Two times a day (BID) | ORAL | 0 refills | Status: AC

## 2018-08-11 MED ORDER — CIPROFLOXACIN HCL 500 MG PO TABS
500.0000 mg | ORAL_TABLET | Freq: Two times a day (BID) | ORAL | Status: DC
  Administered 2018-08-11: 500 mg via ORAL
  Filled 2018-08-11: qty 1

## 2018-08-11 MED ORDER — ALUM & MAG HYDROXIDE-SIMETH 200-200-20 MG/5ML PO SUSP: 30.0000 mL | Freq: Four times a day (QID) | ORAL | 0 refills | Status: DC | PRN

## 2018-08-11 MED ORDER — TRAMADOL HCL 50 MG PO TABS: 50.0000 mg | ORAL_TABLET | Freq: Four times a day (QID) | ORAL | 0 refills | Status: DC | PRN

## 2018-08-11 MED ORDER — DOCUSATE SODIUM 100 MG PO CAPS: 100.0000 mg | ORAL_CAPSULE | Freq: Two times a day (BID) | ORAL | 0 refills | Status: DC

## 2018-08-11 MED ORDER — BUTALBITAL-APAP-CAFFEINE 50-325-40 MG PO TABS
1.0000 | ORAL_TABLET | Freq: Four times a day (QID) | ORAL | Status: DC | PRN
  Administered 2018-08-11: 1 via ORAL
  Filled 2018-08-11: qty 1

## 2018-08-11 NOTE — Anesthesia Postprocedure Evaluation (Signed)
Anesthesia Post Note  Patient: Brittney Santos  Procedure(s) Performed: CYSTOSCOPY WITH RETROGRADE PYELOGRAM, URETEROSCOPY AND STENT PLACEMENT (Left )  Patient location during evaluation: PACU Anesthesia Type: General Level of consciousness: awake and alert Pain management: pain level controlled Vital Signs Assessment: post-procedure vital signs reviewed and stable Respiratory status: spontaneous breathing, nonlabored ventilation, respiratory function stable and patient connected to nasal cannula oxygen Cardiovascular status: blood pressure returned to baseline and stable Postop Assessment: no apparent nausea or vomiting Anesthetic complications: no     Last Vitals:  Vitals:   08/10/18 2244 08/11/18 0556  BP:  118/62  Pulse:  61  Resp:  16  Temp: 36.7 C 36.8 C  SpO2:  95%    Last Pain:  Vitals:   08/11/18 0729  TempSrc:   PainSc: 5                  Lenard SimmerAndrew Kaleia Longhi

## 2018-08-11 NOTE — Progress Notes (Signed)
Discharge order received. Patient is alert and oriented. Vital signs stable . No signs of acute distress. Discharge instructions given. Patient verbalized understanding. No other issues noted at this time.   

## 2018-08-11 NOTE — Discharge Instructions (Signed)
°  Follow-up with primary care physician in 3 to 4 days [facility] weeks   Ureteral Stent Implantation, Care After Refer to this sheet in the next few weeks. These instructions provide you with information about caring for yourself after your procedure. Your health care provider may also give you more specific instructions. Your treatment has been planned according to current medical practices, but problems sometimes occur. Call your health care provider if you have any problems or questions after your procedure.  Removal of the stent/stone-be sure to keep/make follow-up appointment to plan removal of the stone in the stent.  What can I expect after the procedure? After the procedure, it is common to have:  Nausea.  Mild pain when you urinate. You may feel this pain in your lower back or lower abdomen. Pain should stop within a few minutes after you urinate. This may last for up to 1 week.  A small amount of blood in your urine for several days.  Follow these instructions at home:  Medicines  Take over-the-counter and prescription medicines only as told by your health care provider.  If you were prescribed an antibiotic medicine, take it as told by your health care provider. Do not stop taking the antibiotic even if you start to feel better.  Do not drive for 24 hours if you received a sedative.  Do not drive or operate heavy machinery while taking prescription pain medicines. Activity  Return to your normal activities as told by your health care provider. Ask your health care provider what activities are safe for you.  Do not lift anything that is heavier than 10 lb (4.5 kg). Follow this limit for 1 week after your procedure, or for as long as told by your health care provider. General instructions  Watch for any blood in your urine. Call your health care provider if the amount of blood in your urine increases.  If you have a catheter: ? Follow  instructions from your health care provider about taking care of your catheter and collection bag. ? Do not take baths, swim, or use a hot tub until your health care provider approves.  Drink enough fluid to keep your urine clear or pale yellow.  Keep all follow-up visits as told by your health care provider. This is important. Contact a health care provider if:  You have pain that gets worse or does not get better with medicine, especially pain when you urinate.  You have difficulty urinating.  You feel nauseous or you vomit repeatedly during a period of more than 2 days after the procedure. Get help right away if:  Your urine is dark red or has blood clots in it.  You are leaking urine (have incontinence).  The end of the stent comes out of your urethra.  You cannot urinate.  You have sudden, sharp, or severe pain in your abdomen or lower back.  You have a fever. This information is not intended to replace advice given to you by your health care provider. Make sure you discuss any questions you have with your health care provider. Document Released: 08/19/2013 Document Revised: 05/24/2016 Document Reviewed: 07/01/2015 Elsevier Interactive Patient Education  [employer]2018 [employer] Inc[employer]

## 2018-08-11 NOTE — Care Management (Signed)
Patient to discharge today.  Patient confirms that she does not have health insurance. PCP MOREIRA, ROY. Patient states she pays out of pocket her appointments.  Patient request that coupons be printed for [facility].  Coupons for all medications printed from goodrx.com and provided to patient.  Patient denies issues obtaining medications.

## 2018-08-11 NOTE — Progress Notes (Signed)
Administered fiorecett for sinus headache/ppressure per pt. Request.

## 2018-08-11 NOTE — Discharge Summary (Signed)
[place] [facility] [place] NAME: Brittney Santos    MR#:  [id number]  DATE OF BIRTH:  [birth year]  DATE OF ADMISSION:  08/08/2018 ADMITTING PHYSICIAN: Epifanio Lesches, MD  DATE OF DISCHARGE:  08/11/18   PRIMARY CARE PHYSICIAN: Jilda Panda, MD    ADMISSION DIAGNOSIS:  flank pain  DISCHARGE DIAGNOSIS:  Active Problems:   Pyelonephritis  Left ureteral stone status post cystoscopy and stent placement SECONDARY DIAGNOSIS:   Past Medical History:  Diagnosis Date  . Anal condyloma   . Arthritis   . Chronic diarrhea    S/P  CHOLECYSTECTOMY  . GERD (gastroesophageal reflux disease)    WATCHES DIET  . History of basal cell carcinoma excision    X2   FROM  NOSE  . History of cervical cancer    DX  1989--  S/P  TAH WITH BSO IN 1999  . History of colon polyps   . History of kidney stones   . Renal calculus, left    2011    HOSPITAL COURSE:   HPI  Brittney Santos  is a 62 y.o. female with a known history of previous kidney stones, arthritis sent from urgent care because of fever, chills, left flank pain .  The patient started to have leg symptoms couple of days ago, since last night started to have chills, experiencing left flank pain since this morning, dysuria.  Fever 102 Fahrenheit in the emergency room, CT abdomen showed 7 mm stone in left ureter, patient is admitted to medical service for acute pyelonephritis with left hydronephrosis, urologist is consulted by ER physician, Dr. Junious Silk is going to the emergency ureteral stent for this patient tonight.  Patient is n.p.o., started on aggressive hydration.  Receiving IV antibiotics.  Patient says that she has fullness in the left flank but otherwise denies any complaints.no  Nausea.  #Sepsis secondary to acute pyelonephritis Patient met septic criteria at the time of admission with hypotension, fever, leukocytosis Blood cultures are negative  Urine culture with E. coli pansensitive IV  fluids and IV levofloxacin as the patient is allergic to penicillin Discharge patient with p.o. ciprofloxacin for 10 days  #Left ureteral stone with moderate hydronephrosis of the left kidney Status post cystoscopy and left ureteral stent placement by Dr. Junious Silk on 08/08/2018 Pain management as needed with  tramadol Outpatient follow-up with [facility] urology in 2 weeks   #Hypotension-from underlying sepsis Significantly improved with IV fluids Patient reports her baseline blood pressure is 90 /50  # GERD -pepcid  #Headache Fioricet as needed  # history of cervical cancer status post total abdominal hysterectomy with bilateral salpingo-nephrectomy  DISCHARGE CONDITIONS:   Stable  CONSULTS OBTAINED:  Treatment Team:  Festus Aloe, MD   PROCEDURES cystoscopy and left ureteral stent placement  DRUG ALLERGIES:   Allergies  Allergen Reactions  . Lactose Intolerance (Gi)   . Penicillins Itching    Has patient had a PCN reaction causing immediate rash, facial/tongue/throat swelling, SOB or lightheadedness with hypotension: No Has patient had a PCN reaction causing severe rash involving mucus membranes or skin necrosis: No Has patient had a PCN reaction that required hospitalization: No Has patient had a PCN reaction occurring within the last 10 years: No If all of the above answers are "NO", then may proceed with Cephalosporin use.     DISCHARGE MEDICATIONS:   Allergies as of 08/11/2018      Reactions   Lactose Intolerance (gi)    Penicillins Itching  Has patient had a PCN reaction causing immediate rash, facial/tongue/throat swelling, SOB or lightheadedness with hypotension: No Has patient had a PCN reaction causing severe rash involving mucus membranes or skin necrosis: No Has patient had a PCN reaction that required hospitalization: No Has patient had a PCN reaction occurring within the last 10 years: No If all of the above answers are "NO", then may  proceed with Cephalosporin use.      Medication List    TAKE these medications   acetaminophen 500 MG tablet Commonly known as:  TYLENOL Take 500 mg by mouth every 6 (six) hours as needed for mild pain or moderate pain.   alum & mag hydroxide-simeth 200-200-20 MG/5ML suspension Commonly known as:  MAALOX/MYLANTA Take 30 mLs by mouth every 6 (six) hours as needed for indigestion or heartburn.   butalbital-acetaminophen-caffeine 50-325-40 MG tablet Commonly known as:  FIORICET, ESGIC Take 1 tablet by mouth every 8 (eight) hours as needed for headache or migraine.   ciprofloxacin 500 MG tablet Commonly known as:  CIPRO Take 1 tablet (500 mg total) by mouth 2 (two) times daily for 10 days.   docusate sodium 100 MG capsule Commonly known as:  COLACE Take 1 capsule (100 mg total) by mouth 2 (two) times daily.   ondansetron 4 MG tablet Commonly known as:  ZOFRAN Take 1 tablet (4 mg total) by mouth every 6 (six) hours as needed for nausea.   traMADol 50 MG tablet Commonly known as:  ULTRAM Take 1 tablet (50 mg total) by mouth every 6 (six) hours as needed for moderate pain.        DISCHARGE INSTRUCTIONS:   Follow-up with primary care physician in 3 to 4 days Follow-up with [facility] urology in 2 weeks  DIET:  Regular diet  DISCHARGE CONDITION:  Stable  ACTIVITY:  Activity as tolerated  OXYGEN:  Home Oxygen: No.   Oxygen Delivery: room air  DISCHARGE LOCATION:  home   If you experience worsening of your admission symptoms, develop shortness of breath, life threatening emergency, suicidal or homicidal thoughts you must seek medical attention immediately by calling 911 or calling your MD immediately  if symptoms less severe.  You Must read complete instructions/literature along with all the possible adverse reactions/side effects for all the Medicines you take and that have been prescribed to you. Take any new Medicines after you have completely understood and  accpet all the possible adverse reactions/side effects.   Please note  You were cared for by a hospitalist during your hospital stay. If you have any questions about your discharge medications or the care you received while you were in the hospital after you are discharged, you can call the unit and asked to speak with the hospitalist on call if the hospitalist that took care of you is not available. Once you are discharged, your primary care physician will handle any further medical issues. Please note that NO REFILLS for any discharge medications will be authorized once you are discharged, as it is imperative that you return to your primary care physician (or establish a relationship with a primary care physician if you do not have one) for your aftercare needs so that they can reassess your need for medications and monitor your lab values.     Today  Chief Complaint  Patient presents with  . Recurrent UTI  . Flank Pain   Patient is doing much better.  Reports headache but after giving Fioricet headache resolved.  Denies any dizziness.  Wants to go home.  ROS:  CONSTITUTIONAL: Denies fevers, chills. Denies any fatigue, weakness.  EYES: Denies blurry vision, double vision, eye pain. EARS, NOSE, THROAT: Denies tinnitus, ear pain, hearing loss. RESPIRATORY: Denies cough, wheeze, shortness of breath.  CARDIOVASCULAR: Denies chest pain, palpitations, edema.  GASTROINTESTINAL: Denies nausea, vomiting, diarrhea, abdominal pain. Denies bright red blood per rectum. GENITOURINARY: Denies dysuria, hematuria. ENDOCRINE: Denies nocturia or thyroid problems. HEMATOLOGIC AND LYMPHATIC: Denies easy bruising or bleeding. SKIN: Denies rash or lesion. MUSCULOSKELETAL: Denies pain in neck, back, shoulder, knees, hips or arthritic symptoms.  NEUROLOGIC: Denies paralysis, paresthesias.  PSYCHIATRIC: Denies anxiety or depressive symptoms.   VITAL SIGNS:  Blood pressure (!) 105/59, pulse (!) 55,  temperature (!) 97.5 F (36.4 C), temperature source Oral, resp. rate 18, height 5' 6"  (1.676 m), weight 55.6 kg, SpO2 99 %.  I/O:    Intake/Output Summary (Last 24 hours) at 08/11/2018 1419 Last data filed at 08/11/2018 1348 Gross per 24 hour  Intake 2455 ml  Output 2750 ml  Net -295 ml    PHYSICAL EXAMINATION:  GENERAL:  62 y.o.-year-old patient lying in the bed with no acute distress.  EYES: Pupils equal, round, reactive to light and accommodation. No scleral icterus. Extraocular muscles intact.  HEENT: Head atraumatic, normocephalic. Oropharynx and nasopharynx clear.  NECK:  Supple, no jugular venous distention. No thyroid enlargement, no tenderness.  LUNGS: Normal breath sounds bilaterally, no wheezing, rales,rhonchi or crepitation. No use of accessory muscles of respiration.  CARDIOVASCULAR: S1, S2 normal. No murmurs, rubs, or gallops.  ABDOMEN: Soft, non-tender, non-distended. Bowel sounds present. No organomegaly or mass.  EXTREMITIES: No pedal edema, cyanosis, or clubbing.  NEUROLOGIC: Cranial nerves II through XII are intact. Muscle strength 5/5 in all extremities. Sensation intact. Gait not checked.  PSYCHIATRIC: The patient is alert and oriented x 3.  SKIN: No obvious rash, lesion, or ulcer.   DATA REVIEW:   CBC Recent Labs  Lab 08/11/18 0429  WBC 6.6  HGB 9.0*  HCT 25.4*  PLT 208    Chemistries  Recent Labs  Lab 08/10/18 0359  NA 143  K 3.6  CL 113*  CO2 24  GLUCOSE 122*  BUN 10  CREATININE 0.67  CALCIUM 8.2*    Cardiac Enzymes No results for input(s): TROPONINI in the last 168 hours.  Microbiology Results  Results for orders placed or performed during the hospital encounter of 08/08/18  Blood culture (routine x 2)     Status: None (Preliminary result)   Collection Time: 08/08/18  2:19 PM  Result Value Ref Range Status   Specimen Description BLOOD BLOOD RIGHT FOREARM  Final   Special Requests   Final    BLOOD Blood Culture results may not be  optimal due to an inadequate volume of blood received in culture bottles   Culture   Final    NO GROWTH 3 DAYS Performed at [facility], [address], [place], [place] [address]    Report Status PENDING  Incomplete  Urine Culture     Status: Abnormal   Collection Time: 08/08/18  5:19 PM  Result Value Ref Range Status   Specimen Description   Final    URINE, RANDOM Performed at [facility], [address], [place], [place] [address]    Special Requests   Final    NONE Performed at [facility], [address] [place], [place] [address]    Culture >=100,000 COLONIES/mL ESCHERICHIA COLI (A)  Final   Report Status  08/11/2018 FINAL  Final   Organism ID, Bacteria ESCHERICHIA COLI (A)  Final      Susceptibility   Escherichia coli - MIC*    AMPICILLIN <=2 SENSITIVE Sensitive     CEFAZOLIN <=4 SENSITIVE Sensitive     CEFTRIAXONE <=1 SENSITIVE Sensitive     CIPROFLOXACIN <=0.25 SENSITIVE Sensitive     GENTAMICIN <=1 SENSITIVE Sensitive     IMIPENEM <=0.25 SENSITIVE Sensitive     NITROFURANTOIN <=16 SENSITIVE Sensitive     TRIMETH/SULFA <=20 SENSITIVE Sensitive     AMPICILLIN/SULBACTAM <=2 SENSITIVE Sensitive     PIP/TAZO <=4 SENSITIVE Sensitive     Extended ESBL NEGATIVE Sensitive     * >=100,000 COLONIES/mL ESCHERICHIA COLI  Blood culture (routine x 2)     Status: None (Preliminary result)   Collection Time: 08/08/18  8:31 PM  Result Value Ref Range Status   Specimen Description BLOOD LEFT ANTECUBITAL  Final   Special Requests   Final    BOTTLES DRAWN AEROBIC AND ANAEROBIC Blood Culture results may not be optimal due to an excessive volume of blood received in culture bottles   Culture   Final    NO GROWTH 3 DAYS Performed at [facility], [address], [place], [place] [address]    Report Status PENDING  Incomplete    RADIOLOGY:  Ct Abdomen Pelvis W Contrast  Result Date: 08/08/2018 CLINICAL DATA:  Pt sent here from [facility] with left  flank pain, pressure in her bladder, no burning with urination, states she has an UTI, states it does not feel like kidney stones. EXAM: CT ABDOMEN AND PELVIS WITH CONTRAST TECHNIQUE: Multidetector CT imaging of the abdomen and pelvis was performed using the standard protocol following bolus administration of intravenous contrast. CONTRAST:  113m OMNIPAQUE IOHEXOL 300 MG/ML  SOLN COMPARISON:  None. FINDINGS: Lower chest: Clear lung bases.  Heart normal in size. Hepatobiliary: No focal liver abnormality is seen. Status post cholecystectomy. No biliary dilatation. Pancreas: Unremarkable. No pancreatic ductal dilatation or surrounding inflammatory changes. Spleen: Normal in size without focal abnormality. Adrenals/Urinary Tract: No adrenal masses. Mild to moderate left hydronephrosis. This is due to a 7 mm stone in the proximal left ureter just below the ureteropelvic junction. There is another left-sided stone at the level of the pelvic brim that is likely a gonadal vein phlebolith, but a and additional ureteral stone is not excluded. No other evidence of a ureteral stone. No right hydronephrosis. There are no intrarenal stones. There is subtle relative decreased enhancement in portions of the mid and upper pole the left kidney on the delayed sequence. Consider superimposed pyelonephritis given the history of UTI. Bladder is mostly decompressed but otherwise unremarkable. Stomach/Bowel: Stomach is within normal limits. Appendix appears normal. No evidence of bowel wall thickening, distention, or inflammatory changes. Vascular/Lymphatic: Aortic atherosclerosis. No aneurysm. Prominent left periaortic retroperitoneal lymph nodes at the level of the left kidney, largest measuring 9 mm in short axis Reproductive: Status post hysterectomy. No adnexal masses. Other: No abdominal wall hernia or abnormality. No abdominopelvic ascites. Musculoskeletal: No fracture or acute finding. No osteoblastic or osteolytic lesions.  IMPRESSION: 1. 7 mm stone in the proximal left ureter just below the ureteropelvic junction causes mild to moderate left hydronephrosis. There may be an additional smaller stone at the level of the pelvic brim versus a canal vein phlebolith, the latter suspected. 2. Areas of relative decreased enhancement on the delayed sequence along the posterior aspect of the mid to upper left kidney. This is  suspicious for pyelonephritis. No evidence of an abscess. 3. No other acute abnormality within the abdomen or pelvis. 4. Aortic atherosclerosis. Electronically Signed   By: Lajean Manes M.D.   On: 08/08/2018 17:56    EKG:  No orders found for this or any previous visit.    Management plans discussed with the patient, family and they are in agreement.  CODE STATUS:     Code Status Orders  (From admission, onward)         Start     Ordered   08/08/18 1825  Full code  Continuous     08/08/18 1825        Code Status History    This patient has a current code status but no historical code status.      TOTAL TIME TAKING CARE OF THIS PATIENT: 43  minutes.   Note: This dictation was prepared with Dragon dictation along with smaller phrase technology. Any transcriptional errors that result from this process are unintentional.   @M[facility] @  on 08/11/2018 at 2:19 PM  Between 7am to 6pm - Pager - [phone]  After 6pm go to [email or web address] - password [email or web address] [facility]  Office  [phone]  CC: Primary care physician; Jilda Panda, MD

## 2018-08-13 LAB — CULTURE, BLOOD (ROUTINE X 2)
Culture: NO GROWTH
Culture: NO GROWTH

## 2018-08-15 ENCOUNTER — Encounter: Payer: Self-pay | Admitting: Urology

## 2018-08-26 ENCOUNTER — Ambulatory Visit (INDEPENDENT_AMBULATORY_CARE_PROVIDER_SITE_OTHER): Payer: Self-pay | Admitting: Urology

## 2018-08-26 ENCOUNTER — Other Ambulatory Visit: Payer: Self-pay | Admitting: Radiology

## 2018-08-26 ENCOUNTER — Ambulatory Visit
Admission: RE | Admit: 2018-08-26 | Discharge: 2018-08-26 | Disposition: A | Discharge status: Home / Self Care | Payer: Self-pay | Source: Ambulatory Visit | Attending: Urology | Admitting: Urology

## 2018-08-26 ENCOUNTER — Encounter: Payer: Self-pay | Admitting: Urology

## 2018-08-26 VITALS — BP 78/60 | HR 83 | Ht 66.0 in | Wt 108.5 lb

## 2018-08-26 DIAGNOSIS — N12 Tubulo-interstitial nephritis, not specified as acute or chronic: Secondary | ICD-10-CM

## 2018-08-26 DIAGNOSIS — N201 Calculus of ureter: Secondary | ICD-10-CM

## 2018-08-26 DIAGNOSIS — Z960 Presence of urogenital implants: Secondary | ICD-10-CM | POA: Insufficient documentation

## 2018-08-26 DIAGNOSIS — N200 Calculus of kidney: Secondary | ICD-10-CM

## 2018-08-26 LAB — URINALYSIS, COMPLETE
Bilirubin, UA: NEGATIVE
GLUCOSE, UA: NEGATIVE
KETONES UA: NEGATIVE
Nitrite, UA: NEGATIVE
SPEC GRAV UA: 1.01 (ref 1.005–1.030)
Urobilinogen, Ur: 0.2 mg/dL (ref 0.2–1.0)
pH, UA: 6 (ref 5.0–7.5)

## 2018-08-26 LAB — MICROSCOPIC EXAMINATION

## 2018-08-26 MED ORDER — OXYBUTYNIN CHLORIDE ER 10 MG PO TB24: 10.0000 mg | ORAL_TABLET | Freq: Every day | ORAL | 0 refills | Status: DC

## 2018-08-26 MED ORDER — CIPROFLOXACIN HCL 500 MG PO TABS: 500.0000 mg | ORAL_TABLET | Freq: Two times a day (BID) | ORAL | 0 refills | Status: DC

## 2018-08-26 NOTE — Progress Notes (Addendum)
08/26/2018 12:49 PM   Brittney Santos 12/27/1956 [id number]  Referring provider: Ralene OkMoreira, Roy, MD [address] [place], [place] [id number]  CC: LEFT 7mm UPJ stone, pre-stented for UTI  HPI: I the pleasure of seeing Brittney Santos in urology clinic today for a left ureteral stone, pre-stented for UTI.  Briefly, she is a healthy 62 year old female who presented to the emergency room August 9 with fever, infected urine, and left-sided flank pain.  She underwent urgent stent placement with Dr. Mena GoesEskridge, and was treated with 10 days of culture appropriate Cipro for E. coli UTI.  Since then, she has been doing well and her only residual complaints or urinary frequency.  She has had kidney stones in the past treated with shockwave lithotripsy.  She has not taken any anti-inflammatories or aspirin recently.   PMH: Past Medical History:  Diagnosis Date  . Anal condyloma   . Arthritis   . Chronic diarrhea    S/P  CHOLECYSTECTOMY  . GERD (gastroesophageal reflux disease)    WATCHES DIET  . History of basal cell carcinoma excision    X2   FROM  NOSE  . History of cervical cancer    DX  1989--  S/P  TAH WITH BSO IN 1999  . History of colon polyps   . History of kidney stones   . Renal calculus, left    2011    Surgical History: Past Surgical History:  Procedure Laterality Date  . ABDOMINAL HYSTERECTOMY    . CERVICAL CONIZATION W/BX  1998  . CHOLECYSTECTOMY N/A 12/29/2013   Procedure: LAPAROSCOPIC CHOLECYSTECTOMY;  Surgeon: Almond LintFaera Byerly, MD;  Location: [facility];  Service: General;  Laterality: N/A;  . COLONOSCOPY W/ POLYPECTOMY  X2   LAST ONE  NOV  2014  . CYSTOSCOPY WITH RETROGRADE PYELOGRAM, URETEROSCOPY AND STENT PLACEMENT Left 08/08/2018   Procedure: CYSTOSCOPY WITH RETROGRADE PYELOGRAM, URETEROSCOPY AND STENT PLACEMENT;  Surgeon: Jerilee FieldEskridge, Matthew, MD;  Location: [facility];  Service: Urology;  Laterality: Left;  . EXTRACORPOREAL SHOCK WAVE LITHOTRIPSY  1999  . LASER ABLATION CONDOLAMATA  N/A 06/16/2014   Procedure: ANAL EXAM UNDER ANESTHESIA,  LASER ABLATION OF ANAL CONDOLAMATA;  Surgeon: Romie LeveeAlicia Thomas, MD;  [facility];  Service: General;  Laterality: N/A;  . MOHS SURGERY  X2  LAST ONE 2011  . TOTAL ABDOMINAL HYSTERECTOMY W/ BILATERAL SALPINGOOPHORECTOMY  1999   Allergies:  Allergies  Allergen Reactions  . Lactose Intolerance (Gi)   . Penicillins Itching    Has patient had a PCN reaction causing immediate rash, facial/tongue/throat swelling, SOB or lightheadedness with hypotension: No Has patient had a PCN reaction causing severe rash involving mucus membranes or skin necrosis: No Has patient had a PCN reaction that required hospitalization: No Has patient had a PCN reaction occurring within the last 10 years: No If all of the above answers are "NO", then may proceed with Cephalosporin use.     Family History: Family History  Problem Relation Age of Onset  . Cancer Mother        uterus  . Cancer Paternal Aunt        breast  . Bladder Cancer Neg Hx   . Kidney cancer Neg Hx     Social History:  reports that she quit smoking about 6 years ago. Her smoking use included cigarettes. She has a 38.00 pack-year smoking history. She has never used smokeless tobacco. She reports that she drinks alcohol. She reports that she does not use drugs.  ROS: Please  see flowsheet from today's date for complete review of systems.  Physical Exam: BP (!) 78/60 (BP Location: Left Arm, Patient Position: Sitting, Cuff Size: Normal)   Pulse 83   Ht 5\' 6"  (1.676 m)   Wt 108 lb 8 oz (49.2 kg)   BMI 17.51 kg/m    Constitutional:  Alert and oriented, No acute distress. Cardiovascular: No clubbing, cyanosis, or edema. Respiratory: Normal respiratory effort, no increased work of breathing. GI: Abdomen is soft, nontender, nondistended, no abdominal masses GU: No CVA tenderness Lymph: No cervical or inguinal lymphadenopathy. Skin: No rashes, bruises or suspicious  lesions. Neurologic: Grossly intact, no focal deficits, moving all 4 extremities. Psychiatric: Normal mood and affect.  Laboratory Data: Urinalysis today 0-5 WBCs, 3-10 RBCs, few bacteria, nitrite negative   Pertinent Imaging: I have personally reviewed the CT abdomen pelvis dated August 08, 2018.  Notable for a 7 mm left UPJ stone.  Skin to stone distance is 9 cm, 510 Hounsfield units, stone clearly seen on scout film.  KUB today shows  Assessment & Plan:   In summary, Brittney Santos is a healthy 62 year old thin female who underwent left ureteral stent placement August 08, 2018 with Dr. Lennox Laity for an infected left ureteral stone.  Treated with 10 days of culture appropriate Cipro.  She is doing well, aside from mild to moderate urinary frequency secondary to her stent.  We discussed various treatment options for urolithiasis including shockwave lithotripsy (SWL) versus ureteroscopy and laser lithotripsy with stent placement.  We discussed that management is based on stone size, location, density, patient co-morbidities, and patient preference.   SWL has a lower stone free rate in a single procedure, but also a lower complication rate compared to ureteroscopy and avoids a stent and associated stent related symptoms. Possible complications include renal hematoma, steinstrasse, and need for additional treatment.  Ureteroscopy with laser lithotripsy and stent placement has a higher stone free rate than SWL in a single procedure, however increased complication rate including possible infection, ureteral injury, bleeding, and stent related morbidity. Common stent related symptoms include dysuria, urgency/frequency, and flank pain.  - After an extensive discussion of the risks and benefits of the above treatment options, the patient would like to proceed with SWL (SSD 9cm, 510 HU, clearly seen on KUB today in lower pole) - 48 hours cipro pre-op in setting of prior E Coli UTI with indwelling stent -  Trial of ditropan for stent related urinary frequency - Stent removal 7-10 days after SWL with KUB prior  Sondra Come, MD 08/26/2018   [facility] [address] [place], [place] [phone]

## 2018-08-27 ENCOUNTER — Other Ambulatory Visit: Payer: Self-pay | Admitting: Radiology

## 2018-08-27 DIAGNOSIS — N200 Calculus of kidney: Secondary | ICD-10-CM

## 2018-08-27 MED ORDER — CIPROFLOXACIN HCL 500 MG PO TABS: 500.0000 mg | ORAL_TABLET | ORAL | Status: DC

## 2018-08-28 ENCOUNTER — Ambulatory Visit
Admission: RE | Admit: 2018-08-28 | Discharge: 2018-08-28 | Disposition: A | Discharge status: Home / Self Care | Payer: Self-pay | Source: Ambulatory Visit | Attending: Urology | Admitting: Urology

## 2018-08-28 ENCOUNTER — Ambulatory Visit: Payer: Self-pay

## 2018-08-28 ENCOUNTER — Encounter
Admission: RE | Disposition: A | Discharge status: Home / Self Care | Payer: Self-pay | Source: Ambulatory Visit | Attending: Urology

## 2018-08-28 ENCOUNTER — Other Ambulatory Visit: Payer: Self-pay

## 2018-08-28 ENCOUNTER — Encounter: Payer: Self-pay | Admitting: *Deleted

## 2018-08-28 DIAGNOSIS — N200 Calculus of kidney: Secondary | ICD-10-CM | POA: Insufficient documentation

## 2018-08-28 DIAGNOSIS — M1990 Unspecified osteoarthritis, unspecified site: Secondary | ICD-10-CM | POA: Insufficient documentation

## 2018-08-28 DIAGNOSIS — E739 Lactose intolerance, unspecified: Secondary | ICD-10-CM | POA: Insufficient documentation

## 2018-08-28 DIAGNOSIS — Z85828 Personal history of other malignant neoplasm of skin: Secondary | ICD-10-CM | POA: Insufficient documentation

## 2018-08-28 DIAGNOSIS — Z86010 Personal history of colonic polyps: Secondary | ICD-10-CM | POA: Insufficient documentation

## 2018-08-28 DIAGNOSIS — Z880 Allergy status to penicillin: Secondary | ICD-10-CM | POA: Insufficient documentation

## 2018-08-28 HISTORY — PX: EXTRACORPOREAL SHOCK WAVE LITHOTRIPSY: SHX1557

## 2018-08-28 SURGERY — LITHOTRIPSY, ESWL
Anesthesia: Moderate Sedation | Laterality: Left

## 2018-08-28 MED ORDER — ONDANSETRON HCL 4 MG/2ML IJ SOLN
4.0000 mg | Freq: Once | INTRAMUSCULAR | Status: AC | PRN
  Administered 2018-08-28: 4 mg via INTRAVENOUS

## 2018-08-28 MED ORDER — SODIUM CHLORIDE 0.9 % IV SOLN: INTRAVENOUS | Status: DC

## 2018-08-28 MED ORDER — DIAZEPAM 5 MG PO TABS
ORAL_TABLET | ORAL | Status: AC
  Filled 2018-08-28: qty 2

## 2018-08-28 MED ORDER — CIPROFLOXACIN HCL 500 MG PO TABS
ORAL_TABLET | ORAL | Status: AC
  Filled 2018-08-28: qty 1

## 2018-08-28 MED ORDER — DIAZEPAM 5 MG PO TABS
10.0000 mg | ORAL_TABLET | ORAL | Status: AC
  Administered 2018-08-28: 10 mg via ORAL

## 2018-08-28 MED ORDER — DIPHENHYDRAMINE HCL 25 MG PO CAPS
25.0000 mg | ORAL_CAPSULE | ORAL | Status: AC
  Administered 2018-08-28: 25 mg via ORAL

## 2018-08-28 MED ORDER — DIPHENHYDRAMINE HCL 25 MG PO CAPS
ORAL_CAPSULE | ORAL | Status: AC
  Filled 2018-08-28: qty 1

## 2018-08-28 MED ORDER — ONDANSETRON HCL 4 MG/2ML IJ SOLN
INTRAMUSCULAR | Status: AC
  Filled 2018-08-28: qty 2

## 2018-08-28 NOTE — OR Nursing (Signed)
Discharge instructions discussed with pt and Sherri - sister. Both voice understanding.

## 2018-09-04 ENCOUNTER — Ambulatory Visit (INDEPENDENT_AMBULATORY_CARE_PROVIDER_SITE_OTHER): Payer: Self-pay | Admitting: Urology

## 2018-09-04 ENCOUNTER — Encounter: Payer: Self-pay | Admitting: Urology

## 2018-09-04 ENCOUNTER — Ambulatory Visit
Admission: RE | Admit: 2018-09-04 | Discharge: 2018-09-04 | Disposition: A | Discharge status: Home / Self Care | Payer: Self-pay | Source: Ambulatory Visit | Attending: Urology | Admitting: Urology

## 2018-09-04 ENCOUNTER — Other Ambulatory Visit: Payer: Self-pay | Admitting: Radiology

## 2018-09-04 VITALS — BP 86/46 | HR 75 | Ht 66.0 in | Wt 109.0 lb

## 2018-09-04 DIAGNOSIS — N200 Calculus of kidney: Secondary | ICD-10-CM | POA: Insufficient documentation

## 2018-09-04 DIAGNOSIS — Z960 Presence of urogenital implants: Secondary | ICD-10-CM | POA: Insufficient documentation

## 2018-09-04 LAB — MICROSCOPIC EXAMINATION: Epithelial Cells (non renal): NONE SEEN /hpf (ref 0–10)

## 2018-09-04 LAB — URINALYSIS, COMPLETE
BILIRUBIN UA: NEGATIVE
GLUCOSE, UA: NEGATIVE
KETONES UA: NEGATIVE
Nitrite, UA: NEGATIVE
SPEC GRAV UA: 1.015 (ref 1.005–1.030)
UUROB: 0.2 mg/dL (ref 0.2–1.0)
pH, UA: 6 (ref 5.0–7.5)

## 2018-09-04 MED ORDER — SULFAMETHOXAZOLE-TRIMETHOPRIM 800-160 MG PO TABS: 1.0000 | ORAL_TABLET | Freq: Every day | ORAL | 0 refills | Status: DC

## 2018-09-04 MED ORDER — TAMSULOSIN HCL 0.4 MG PO CAPS: 0.4000 mg | ORAL_CAPSULE | Freq: Every day | ORAL | 0 refills | Status: DC

## 2018-09-04 NOTE — Progress Notes (Signed)
   09/04/2018 12:31 PM   Laurier Nancy September 14, 1956 [id number]  Reason for visit: Follow up s/p Left SWL  HPI: I had the pleasure of seeing Ms. Stryker in urology clinic today for follow-up of left ureteral stone.  She was originally hospitalized August 9 with urinary tract infection and a 9 mm left proximal ureteral stone, and underwent urgent stent placement with Dr. Mena Goes.  During stent placement the stone was pushed back up into the lower pole.  She underwent left-sided shockwave lithotripsy with Dr. Lonna Cobb on 8/29.  She continues to report some mild urinary symptoms including urgency and frequency, though denies flank pain or hematuria.  Her KUB today shows a residual 4 to 5 mm fragment in the lower pole.   ROS: Please see flowsheet from today's date for complete review of systems.  Physical Exam: BP (!) 86/46 (BP Location: Left Arm, Patient Position: Sitting, Cuff Size: Normal)   Pulse 75   Ht 5\' 6"  (1.676 m)   Wt 109 lb (49.4 kg)   BMI 17.59 kg/m    Constitutional:  Alert and oriented, No acute distress. Respiratory: Normal respiratory effort, no increased work of breathing. GI: Abdomen is soft, nontender, nondistended, no abdominal masses GU: No CVA tenderness Skin: No rashes, bruises or suspicious lesions. Neurologic: Grossly intact, no focal deficits, moving all 4 extremities. Psychiatric: Normal mood and affect  Laboratory Data: Urinalysis today 0-5 WBCs, 0-2 RBCs, few bacteria, nitrite negative  Pertinent Imaging:  I have personally reviewed the KUB from today, residual 4 to 5 mm fragment in the lower pole, stent in appropriate position  Assessment & Plan:   We had a long discussion about her options moving forward today.  We discussed stent removal and observation of her lower pole fragment, definitive management with ureteroscopy and laser lithotripsy, or repeat shockwave lithotripsy in 3 to 4 weeks with stent removal 1 to 2 weeks later.  SWL has a lower  stone free rate in a single procedure, but also a lower complication rate compared to ureteroscopy and avoids a stent and associated stent related symptoms. Possible complications include renal hematoma, steinstrasse, and need for additional treatment.  Ureteroscopy with laser lithotripsy and stent placement has a higher stone free rate than SWL in a single procedure, however increased complication rate including possible infection, ureteral injury, bleeding, and stent related morbidity. Common stent related symptoms include dysuria, urgency/frequency, and flank pain.  She is resistant to any procedure that would increase her risk of repeat urinary infection, and does not want to have her stent removed until her lower pole stone is treated as she is traveling quite a bit over the next few months.  We discussed that we would need to wait at least 3 weeks between shockwave treatments.  I did counsel her that I would recommend ureteroscopy for definitive management of her lower pole stone, however she would like to proceed with repeat shockwave treatment.  PLAN:  -Repeat left SWL for 52mm lower pole fragment in ~3 weeks, then KUB and stent removal 10 days post op. -Bactrim DS daily ppx while stent in -Flomax for stent related symptoms, did not tolerate anti-cholinergic   Sondra Come, MD  [facility] [address] [place], [place] [address] [phone]

## 2018-09-04 NOTE — Addendum Note (Signed)
Addended by: Sondra Come on: 09/04/2018 12:44 PM   Modules accepted: Level of Service

## 2018-10-01 MED ORDER — CIPROFLOXACIN HCL 500 MG PO TABS
500.0000 mg | ORAL_TABLET | ORAL | Status: AC
  Administered 2018-10-02: 500 mg via ORAL

## 2018-10-02 ENCOUNTER — Ambulatory Visit: Payer: Self-pay

## 2018-10-02 ENCOUNTER — Encounter: Payer: Self-pay | Admitting: *Deleted

## 2018-10-02 ENCOUNTER — Other Ambulatory Visit: Payer: Self-pay

## 2018-10-02 ENCOUNTER — Ambulatory Visit
Admission: RE | Admit: 2018-10-02 | Discharge: 2018-10-02 | Disposition: A | Discharge status: Home / Self Care | Payer: Self-pay | Source: Ambulatory Visit | Attending: Urology | Admitting: Urology

## 2018-10-02 ENCOUNTER — Encounter
Admission: RE | Disposition: A | Discharge status: Home / Self Care | Payer: Self-pay | Source: Ambulatory Visit | Attending: Urology

## 2018-10-02 DIAGNOSIS — Z880 Allergy status to penicillin: Secondary | ICD-10-CM | POA: Insufficient documentation

## 2018-10-02 DIAGNOSIS — K219 Gastro-esophageal reflux disease without esophagitis: Secondary | ICD-10-CM | POA: Insufficient documentation

## 2018-10-02 DIAGNOSIS — N201 Calculus of ureter: Secondary | ICD-10-CM | POA: Insufficient documentation

## 2018-10-02 DIAGNOSIS — N200 Calculus of kidney: Secondary | ICD-10-CM

## 2018-10-02 DIAGNOSIS — Z85828 Personal history of other malignant neoplasm of skin: Secondary | ICD-10-CM | POA: Insufficient documentation

## 2018-10-02 HISTORY — PX: EXTRACORPOREAL SHOCK WAVE LITHOTRIPSY: SHX1557

## 2018-10-02 SURGERY — LITHOTRIPSY, ESWL
Anesthesia: Moderate Sedation | Laterality: Left

## 2018-10-02 MED ORDER — DIAZEPAM 5 MG PO TABS
10.0000 mg | ORAL_TABLET | ORAL | Status: AC
  Administered 2018-10-02: 10 mg via ORAL

## 2018-10-02 MED ORDER — DIPHENHYDRAMINE HCL 25 MG PO CAPS
ORAL_CAPSULE | ORAL | Status: AC
  Filled 2018-10-02: qty 1

## 2018-10-02 MED ORDER — CIPROFLOXACIN HCL 500 MG PO TABS
ORAL_TABLET | ORAL | Status: AC
  Filled 2018-10-02: qty 1

## 2018-10-02 MED ORDER — HYDROCODONE-ACETAMINOPHEN 5-325 MG PO TABS: 1.0000 | ORAL_TABLET | ORAL | Status: DC | PRN

## 2018-10-02 MED ORDER — DIPHENHYDRAMINE HCL 25 MG PO CAPS
25.0000 mg | ORAL_CAPSULE | ORAL | Status: AC
  Administered 2018-10-02: 25 mg via ORAL

## 2018-10-02 MED ORDER — ONDANSETRON HCL 4 MG/2ML IJ SOLN
4.0000 mg | Freq: Once | INTRAMUSCULAR | Status: AC | PRN
  Administered 2018-10-02: 4 mg via INTRAVENOUS

## 2018-10-02 MED ORDER — SODIUM CHLORIDE 0.9 % IV SOLN
INTRAVENOUS | Status: DC
  Administered 2018-10-02: 07:00:00 via INTRAVENOUS

## 2018-10-02 MED ORDER — DIAZEPAM 5 MG PO TABS
ORAL_TABLET | ORAL | Status: AC
  Filled 2018-10-02: qty 2

## 2018-10-02 MED ORDER — TAMSULOSIN HCL 0.4 MG PO CAPS: 0.4000 mg | ORAL_CAPSULE | Freq: Every day | ORAL | 0 refills | Status: DC

## 2018-10-02 MED ORDER — ONDANSETRON HCL 4 MG/2ML IJ SOLN
INTRAMUSCULAR | Status: AC
  Administered 2018-10-02: 4 mg via INTRAVENOUS
  Filled 2018-10-02: qty 2

## 2018-10-02 NOTE — Discharge Instructions (Signed)

## 2018-10-02 NOTE — Progress Notes (Signed)
Upon return from the litho truck, it was observed that the patient had a quarter sized break in the skin on her right flank. The skin break was due to the litho procedure. The skin break was covered with gauze and paper tape.

## 2018-10-02 NOTE — H&P (Signed)
UROLOGY H&P UPDATE  Agree with prior H&P dated 09/04/2018  Cardiac: RRR Lungs: CTA bilaterally  Laterality: LEFT Procedure: LEFT SWL  Urinalysis: 9/5: Negative, has been on ppx Bactrim daily with stent in  Originally underwent left stent placement for 9mm proximal ureteral stone with UTI on 08/08/2018. Stone pushed up into kidney. S/p L SWL 8/29 with Dr. Lonna Cobb, residual 5mm lower pole stone on KUB. She desires repeat SWL for this fragment. Will follow up with KUB in 1-2 weeks and stent removal.  Informed consent obtained for LEFT SWL of lower pole 5mm residual fragment, we specifically discussed the risk of bleeding, infection/sepsis, hematoma, steinstrasse, and need for additional procedures.   Sondra Come, MD 10/02/2018

## 2018-10-06 ENCOUNTER — Encounter: Payer: Self-pay | Admitting: Urology

## 2018-10-09 ENCOUNTER — Ambulatory Visit (INDEPENDENT_AMBULATORY_CARE_PROVIDER_SITE_OTHER): Payer: Self-pay | Admitting: Urology

## 2018-10-09 ENCOUNTER — Encounter: Payer: Self-pay | Admitting: Urology

## 2018-10-09 ENCOUNTER — Ambulatory Visit
Admission: RE | Admit: 2018-10-09 | Discharge: 2018-10-09 | Disposition: A | Discharge status: Home / Self Care | Payer: Self-pay | Source: Ambulatory Visit | Attending: Urology | Admitting: Urology

## 2018-10-09 ENCOUNTER — Other Ambulatory Visit: Payer: Self-pay

## 2018-10-09 VITALS — BP 86/54 | HR 75 | Ht 66.0 in | Wt 108.0 lb

## 2018-10-09 DIAGNOSIS — Z9689 Presence of other specified functional implants: Secondary | ICD-10-CM | POA: Insufficient documentation

## 2018-10-09 DIAGNOSIS — N200 Calculus of kidney: Secondary | ICD-10-CM

## 2018-10-09 LAB — URINALYSIS, COMPLETE
BILIRUBIN UA: NEGATIVE
Glucose, UA: NEGATIVE
Ketones, UA: NEGATIVE
Nitrite, UA: NEGATIVE
PH UA: 6 (ref 5.0–7.5)
SPEC GRAV UA: 1.02 (ref 1.005–1.030)
Urobilinogen, Ur: 0.2 mg/dL (ref 0.2–1.0)

## 2018-10-09 LAB — MICROSCOPIC EXAMINATION: RBC, UA: >30 /hpf — ABNORMAL HIGH (ref 0–2)

## 2018-10-09 MED ORDER — CIPROFLOXACIN HCL 500 MG PO TABS
500.0000 mg | ORAL_TABLET | Freq: Once | ORAL | Status: AC
  Administered 2018-10-09: 500 mg via ORAL

## 2018-10-09 NOTE — Progress Notes (Signed)
Cystoscopy Procedure Note:  Indication: Stent removal s/p Left SWLx2, stent originally placed 08/08/2018 for infected proximal ureteral stone.  After informed consent and discussion of the procedure and its risks, Brittney Santos was positioned and prepped in the standard fashion. Cystoscopy was performed with a flexible cystoscope. The stent was grasped with flexible graspers and removed in its entirety. The patient tolerated the procedure well.  Findings: Uncomplicated stent removal  Assessment and Plan: Follow up in one year with KUB  Sondra Come, MD 10/09/2018

## 2019-01-12 IMAGING — CR DG ABDOMEN 1V
2 series · 2 of 2 positions shown · non-contrast
Comparison: 08/28/2018 radiograph and 08/08/2018 CT

CLINICAL DATA: LEFT ureteral stent placement. Lithotripsy performed
on 08/28/2018.

EXAM:
ABDOMEN - 1 VIEW

[abdomen kub (1 of 2)]
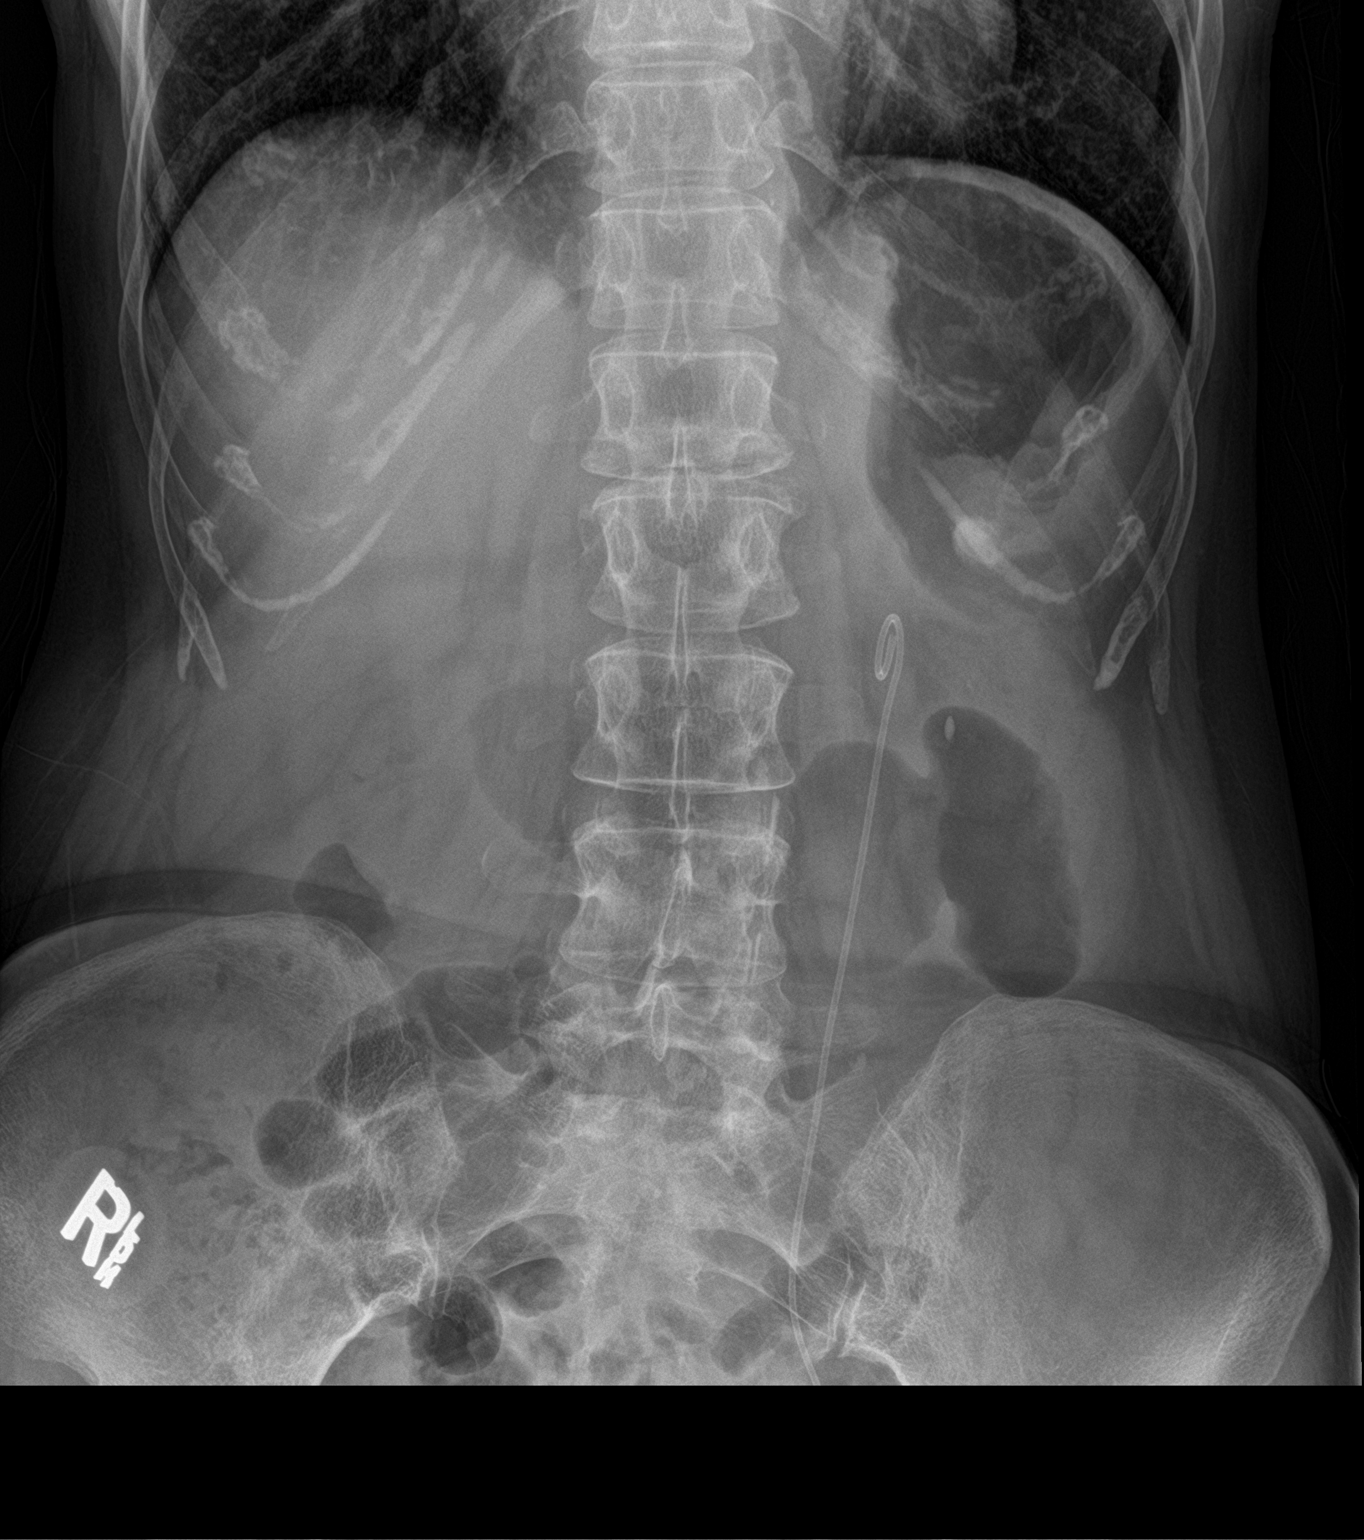

[abdomen kub (2 of 2)]
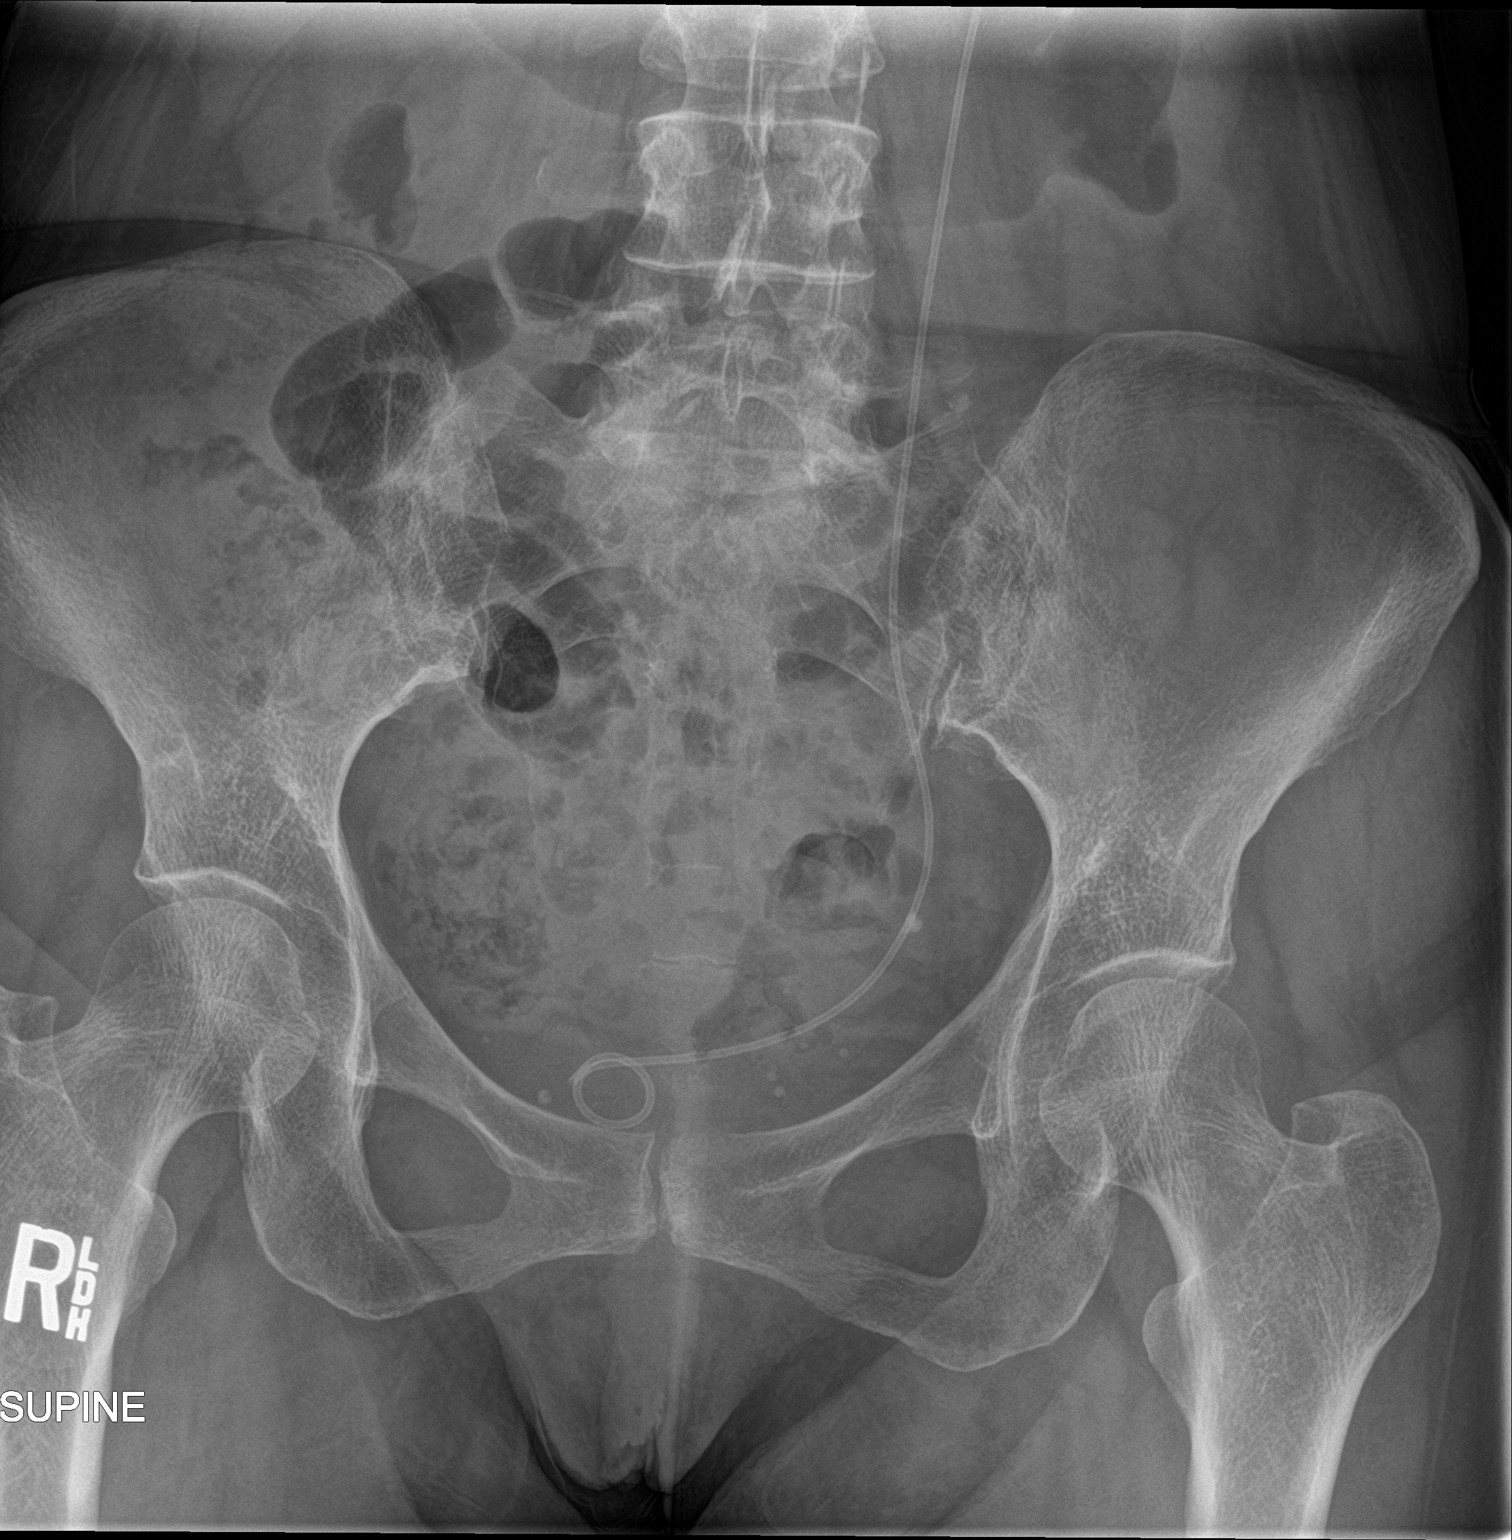

[2 of 2 positions shown; findings below may reference images not displayed]

FINDINGS: A LEFT urinary stent is again identified with a 4 mm calcification
along the distal stent is again noted but has the appearance of a
phlebolith on recent CT

A 6 mm calcification overlying the LEFT abdomen may lie within the
LOWER LEFT kidney.

No other calcifications are identified overlying the renal shadows
or expected course of the ureters.

Bowel gas pattern is unremarkable.
IMPRESSION: 1. 6 mm calcification overlying the LEFT abdomen which may lie
within the LOWER LEFT kidney.
2. 4 mm calcification along the distal LEFT ureteral stent, however
felt to be a phlebolith on recent CT.
3. No other radiopaque urinary calculi identified.

## 2019-02-09 IMAGING — CR DG ABDOMEN 1V
2 series · 2 of 2 positions shown · non-contrast
Comparison: 09/04/2018

CLINICAL DATA: Pre lithotripsy.

EXAM:
ABDOMEN - 1 VIEW

[abdomen kub (1 of 2)]
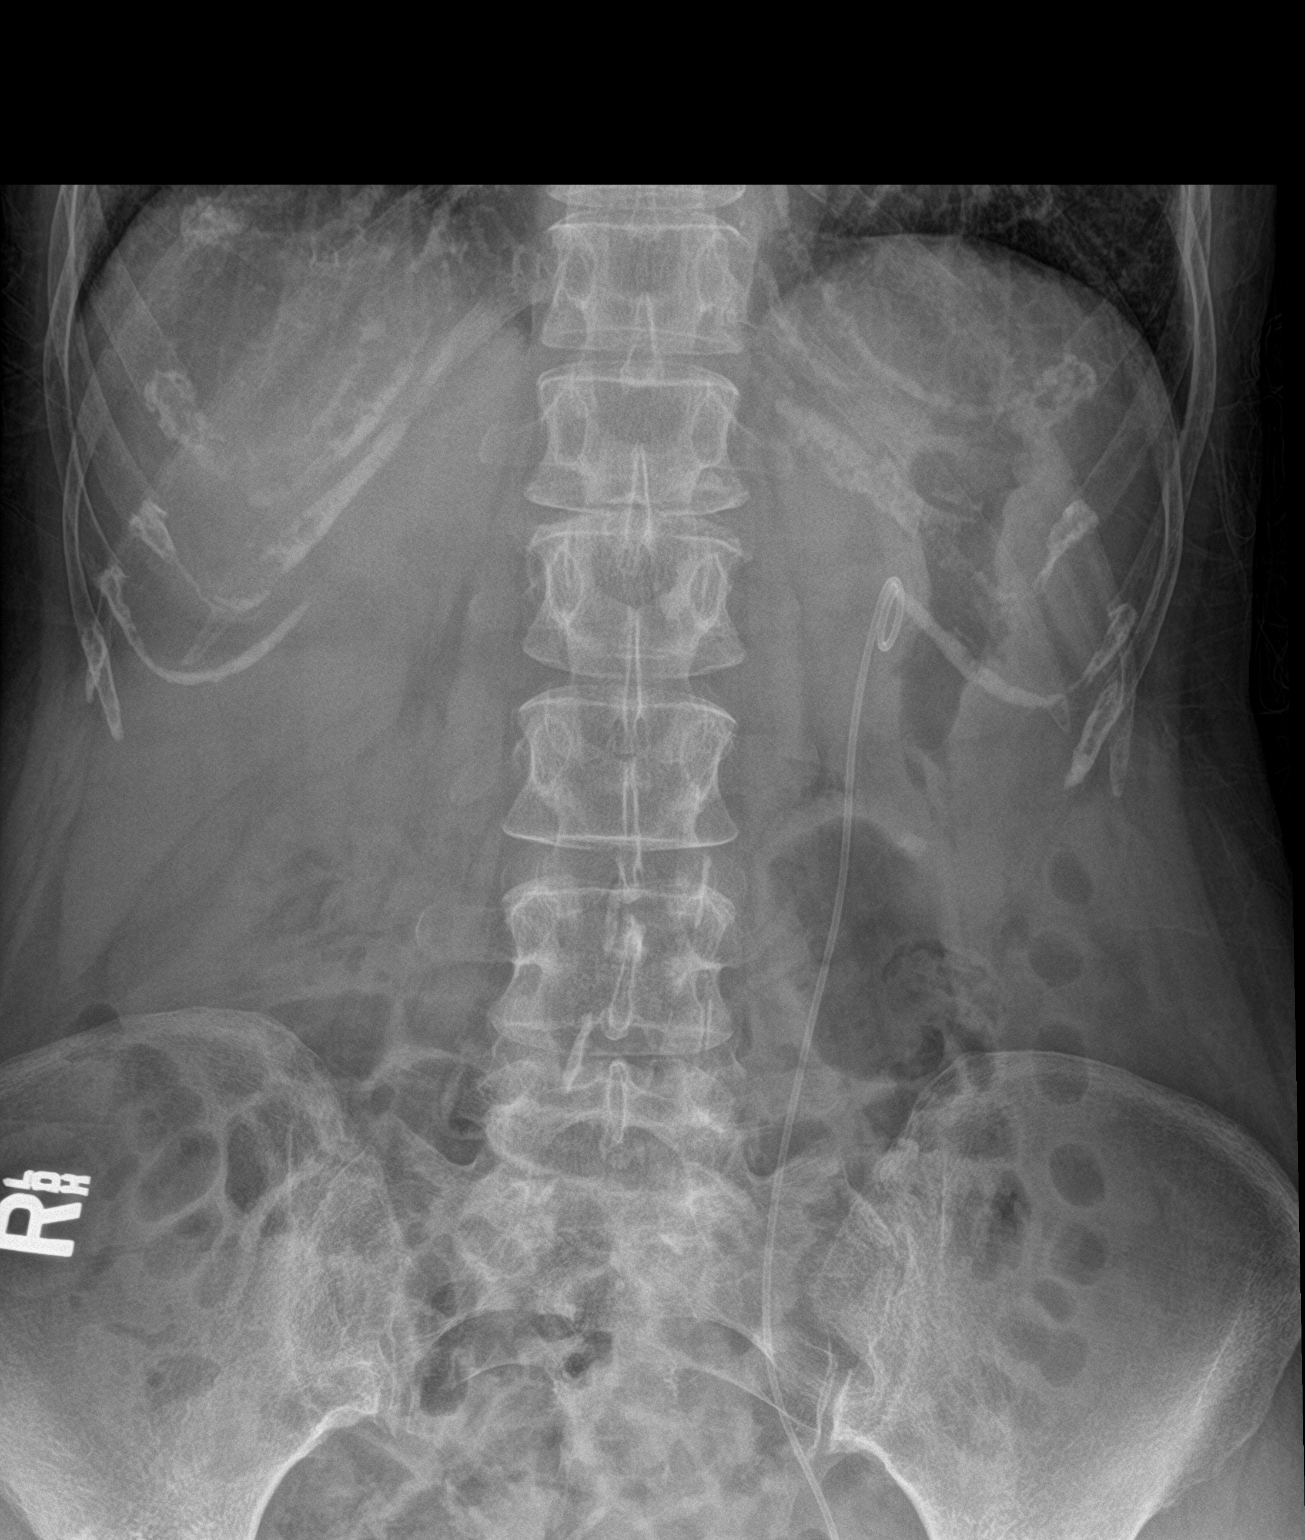

[abdomen kub (2 of 2)]
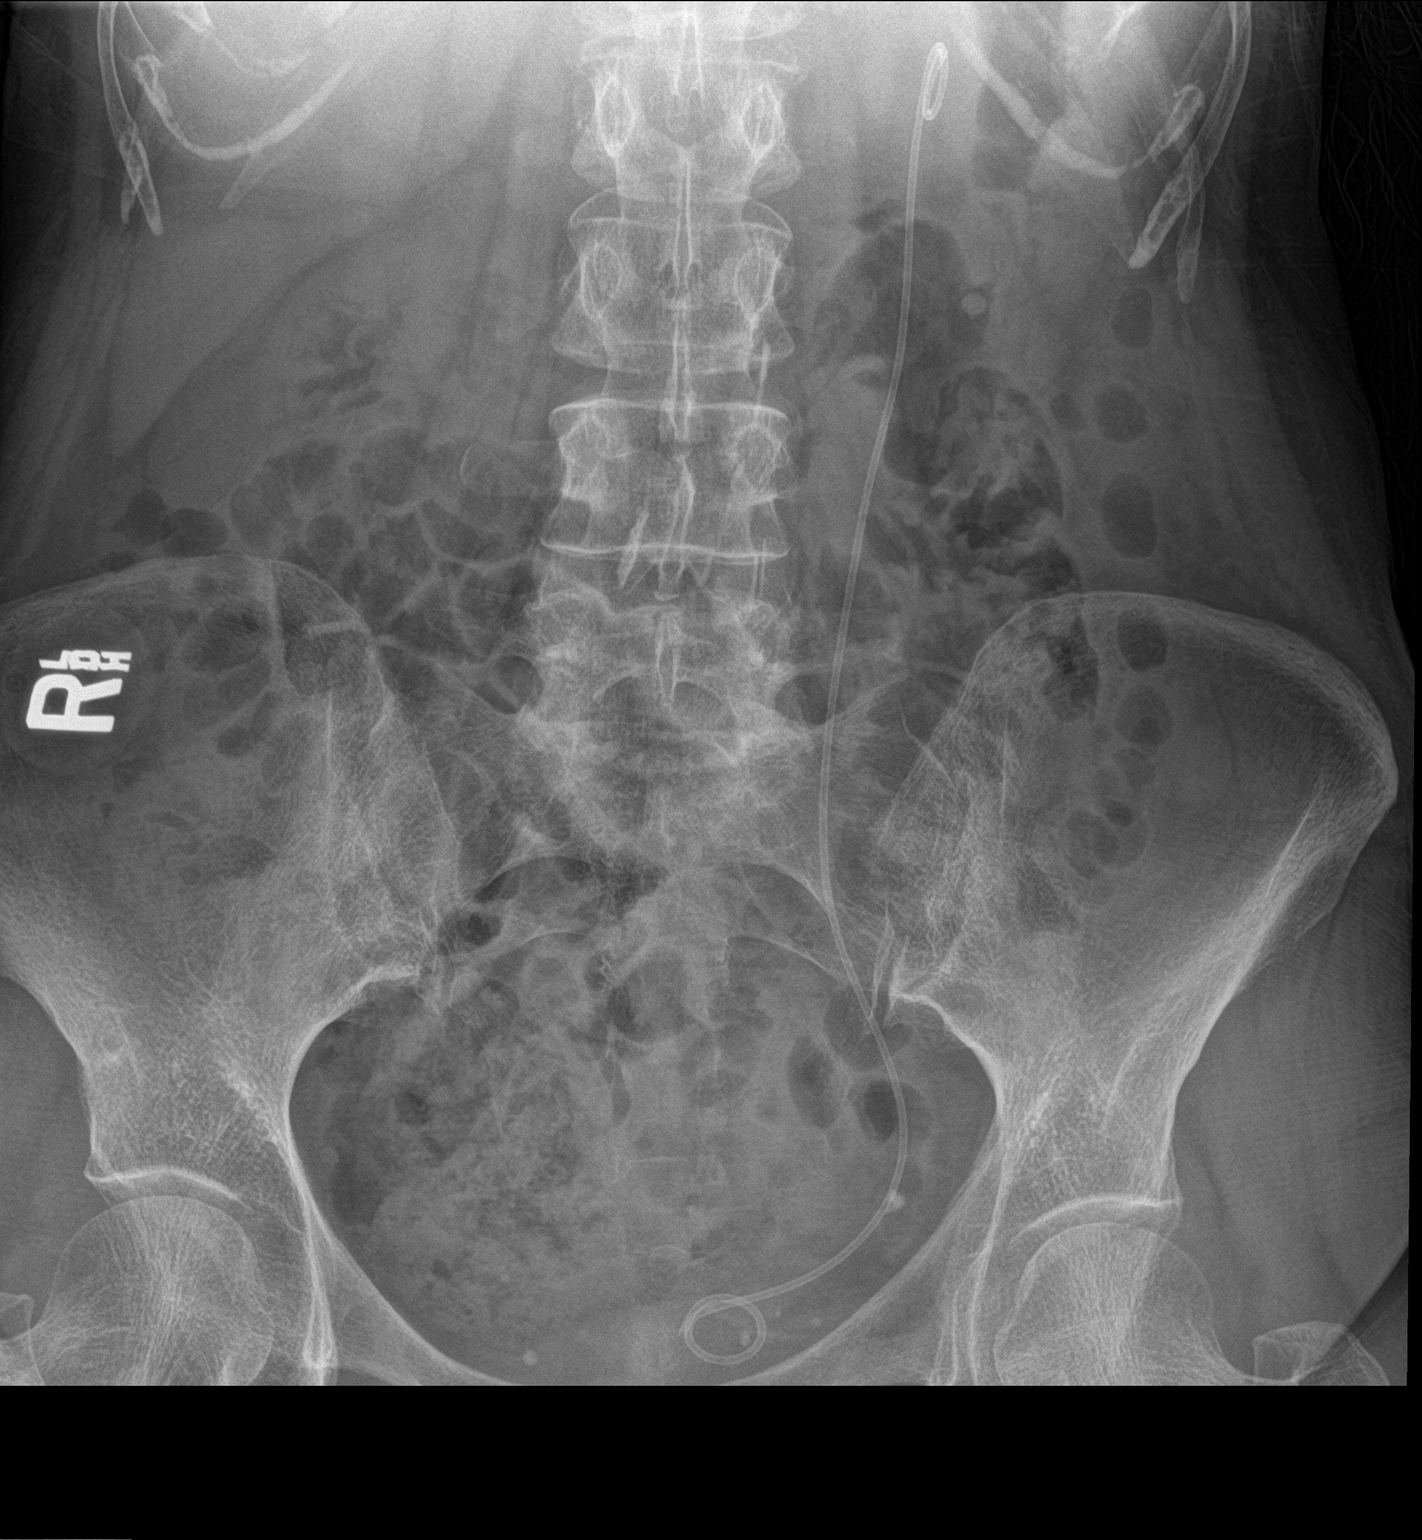

[2 of 2 positions shown; findings below may reference images not displayed]

FINDINGS: Left-sided double-J nephroureteral stent is identified. The distal
left ureteral calculus is again noted and is in a similar position
to the previous exam measuring approximately 4 mm. Rounded density
within the left upper abdomen is identified in the projection of the
lower pole of left kidney which may represent previous left UPJ
calculus.
IMPRESSION: 1. Satisfactory position of left nephroureteral stent.
2. Persistent distal left ureteral calculus. A rounded density
projects over the lower pole of the left kidney which may represent
previously noted left UPJ calculus.

## 2019-10-12 ENCOUNTER — Ambulatory Visit: Payer: Self-pay | Admitting: Urology

## 2019-10-12 ENCOUNTER — Encounter: Payer: Self-pay | Admitting: Urology

## 2020-04-13 ENCOUNTER — Ambulatory Visit: Payer: Self-pay | Attending: Internal Medicine

## 2020-04-13 DIAGNOSIS — Z23 Encounter for immunization: Secondary | ICD-10-CM

## 2020-04-13 NOTE — Progress Notes (Signed)
   Covid-19 Vaccination Clinic  Name:  Nadya Hopwood    MRN: [id number] DOB: [birth year]  04/13/2020  Ms. Perfecto was observed post Covid-19 immunization for 30 minutes based on pre-vaccination screening without incident. She was provided with Vaccine Information Sheet and instruction to access the V-Safe system.   Ms. Hackworth was instructed to call [phone] with any severe reactions post vaccine: Marland Kitchen Difficulty breathing  . Swelling of face and throat  . A fast heartbeat  . A bad rash all over body  . Dizziness and weakness   Immunizations Administered    Name Date Dose VIS Date Route   Moderna COVID-19 Vaccine 04/13/2020 10:50 AM 0.5 mL 12/01/2019 Intramuscular   Manufacturer: Moderna   Lot: [id number]   NDC: 29021-115-52

## 2022-07-27 ENCOUNTER — Other Ambulatory Visit: Payer: Self-pay | Admitting: Internal Medicine

## 2022-07-27 DIAGNOSIS — Z1231 Encounter for screening mammogram for malignant neoplasm of breast: Secondary | ICD-10-CM

## 2022-08-01 ENCOUNTER — Other Ambulatory Visit: Payer: Self-pay | Admitting: Internal Medicine

## 2022-08-15 ENCOUNTER — Other Ambulatory Visit: Payer: Self-pay | Admitting: Internal Medicine

## 2022-08-15 DIAGNOSIS — F17200 Nicotine dependence, unspecified, uncomplicated: Secondary | ICD-10-CM

## 2022-08-15 DIAGNOSIS — Z122 Encounter for screening for malignant neoplasm of respiratory organs: Secondary | ICD-10-CM

## 2022-09-05 ENCOUNTER — Ambulatory Visit
Admission: RE | Admit: 2022-09-05 | Discharge: 2022-09-05 | Disposition: A | Discharge status: Home / Self Care | Payer: Medicare Other | Source: Ambulatory Visit | Attending: Internal Medicine | Admitting: Internal Medicine

## 2022-09-05 DIAGNOSIS — F17200 Nicotine dependence, unspecified, uncomplicated: Secondary | ICD-10-CM

## 2022-09-05 DIAGNOSIS — Z122 Encounter for screening for malignant neoplasm of respiratory organs: Secondary | ICD-10-CM

## 2022-09-06 ENCOUNTER — Ambulatory Visit
Admission: RE | Admit: 2022-09-06 | Discharge: 2022-09-06 | Disposition: A | Discharge status: Home / Self Care | Payer: Medicare Other | Source: Ambulatory Visit | Attending: Internal Medicine | Admitting: Internal Medicine

## 2022-09-06 DIAGNOSIS — Z1231 Encounter for screening mammogram for malignant neoplasm of breast: Secondary | ICD-10-CM

## 2022-09-26 ENCOUNTER — Ambulatory Visit: Payer: Medicare Other | Admitting: Internal Medicine

## 2022-09-26 ENCOUNTER — Encounter: Payer: Self-pay | Admitting: Internal Medicine

## 2022-09-26 VITALS — Temp 97.9°F | Resp 16 | Ht 66.0 in | Wt 126.0 lb

## 2022-09-26 DIAGNOSIS — I209 Angina pectoris, unspecified: Secondary | ICD-10-CM | POA: Insufficient documentation

## 2022-09-26 DIAGNOSIS — R002 Palpitations: Secondary | ICD-10-CM

## 2022-09-26 DIAGNOSIS — J438 Other emphysema: Secondary | ICD-10-CM | POA: Insufficient documentation

## 2022-09-26 DIAGNOSIS — J449 Chronic obstructive pulmonary disease, unspecified: Secondary | ICD-10-CM | POA: Insufficient documentation

## 2022-09-26 DIAGNOSIS — I2510 Atherosclerotic heart disease of native coronary artery without angina pectoris: Secondary | ICD-10-CM | POA: Insufficient documentation

## 2022-09-26 DIAGNOSIS — R0609 Other forms of dyspnea: Secondary | ICD-10-CM | POA: Insufficient documentation

## 2022-09-26 MED ORDER — ROSUVASTATIN CALCIUM 5 MG PO TABS: 5.0000 mg | ORAL_TABLET | Freq: Every day | ORAL | 2 refills | Status: DC

## 2022-09-26 MED ORDER — NITROGLYCERIN 0.4 MG SL SUBL: 0.4000 mg | SUBLINGUAL_TABLET | SUBLINGUAL | 3 refills | Status: DC | PRN

## 2022-09-26 NOTE — Progress Notes (Signed)
Primary Physician/Referring:  Jilda Panda, MD  Patient ID: Brittney Santos, female    DOB: September 11, 1956, 66 y.o.   MRN: [id number]  No chief complaint on file.  HPI:    Brittney Santos  is a 66 y.o. female with coronary calcification noted on CT scan of the chest who is here to establish care with cardiology.  Recently she has been having dyspnea on exertion, chest pressure and pain with activity, and overall fatigue.  Patient is generally very active however she has not been able to do her normal activity recently.  She was started on baby aspirin by her primary care doctor.  She is reluctant to try a statin however this would benefit her given the aortic and coronary calcification noted on CT scan.  Patient is agreeable to coronary CTA to delineate the amount of calcification and plaque that may be in her coronary arteries.  Her risk factors include strong family history in addition to tobacco dependence.  Patient has never had an echocardiogram or stress test in the past.  Will order this in addition to coronary CT given her anginal symptoms.  Past Medical History:  Diagnosis Date   Anal condyloma    Arthritis    Chronic diarrhea    S/P  CHOLECYSTECTOMY   GERD (gastroesophageal reflux disease)    WATCHES DIET   History of basal cell carcinoma excision    X2   FROM  NOSE   History of cervical cancer    DX  1989--  S/P  TAH WITH BSO IN 1999   History of colon polyps    History of kidney stones    Renal calculus, left    2011   Past Surgical History:  Procedure Laterality Date   ABDOMINAL HYSTERECTOMY     CERVICAL CONIZATION W/BX  1998   CHOLECYSTECTOMY N/A 12/29/2013   Procedure: LAPAROSCOPIC CHOLECYSTECTOMY;  Surgeon: Stark Klein, MD;  Location: [facility];  Service: General;  Laterality: N/A;   COLONOSCOPY W/ POLYPECTOMY  X2   LAST ONE  NOV  2014   CYSTOSCOPY WITH RETROGRADE PYELOGRAM, URETEROSCOPY AND STENT PLACEMENT Left 08/08/2018   Procedure: CYSTOSCOPY WITH RETROGRADE PYELOGRAM,  URETEROSCOPY AND STENT PLACEMENT;  Surgeon: Festus Aloe, MD;  Location: [facility];  Service: Urology;  Laterality: Left;   EXTRACORPOREAL SHOCK WAVE LITHOTRIPSY  1999   EXTRACORPOREAL SHOCK WAVE LITHOTRIPSY Left 08/28/2018   Procedure: EXTRACORPOREAL SHOCK WAVE LITHOTRIPSY (ESWL);  Surgeon: Abbie Sons, MD;  Location: [facility];  Service: Urology;  Laterality: Left;   EXTRACORPOREAL SHOCK WAVE LITHOTRIPSY Left 10/02/2018   Procedure: EXTRACORPOREAL SHOCK WAVE LITHOTRIPSY (ESWL);  Surgeon: Billey Co, MD;  Location: [facility];  Service: Urology;  Laterality: Left;   LASER ABLATION CONDOLAMATA N/A 06/16/2014   Procedure: ANAL EXAM UNDER ANESTHESIA,  LASER ABLATION OF ANAL CONDOLAMATA;  Surgeon: Leighton Ruff, MD;  Location: [facility];  Service: General;  Laterality: N/A;   MOHS SURGERY  X2  LAST ONE 2011   TOTAL ABDOMINAL HYSTERECTOMY W/ BILATERAL SALPINGOOPHORECTOMY  1999   Family History  Problem Relation Age of Onset   Cancer Mother        uterus   Breast cancer Paternal Aunt    Cancer Paternal Aunt        breast   Bladder Cancer Neg Hx    Kidney cancer Neg Hx     Social History   Tobacco Use   Smoking status: Former    Packs/day: 1.00    Years: 38.00  Total pack years: 38.00    Types: Cigarettes    Quit date: 01/01/2012    Years since quitting: 10.7   Smokeless tobacco: Never  Substance Use Topics   Alcohol use: Yes    Comment: occasionally   Marital Status: Divorced  ROS  Review of Systems  Constitutional: Positive for malaise/fatigue.  Cardiovascular:  Positive for chest pain, claudication, dyspnea on exertion, irregular heartbeat and palpitations.  Neurological:  Positive for dizziness and light-headedness.   Objective  Temperature 97.9 F (36.6 C), temperature source Temporal, resp. rate 16, height 5' 6" (1.676 m), weight 126 lb (57.2 kg), SpO2 99 %. Body mass index is 20.34 kg/m.     09/26/2022   10:08 AM 10/09/2018    1:20 PM  10/02/2018    9:05 AM  Vitals with BMI  Height 5' 6" 5' 6"   Weight 126 lbs 108 lbs   BMI 52.84 13.24   Systolic  86 401  Diastolic  54 50  Pulse  75 58     Physical Exam Vitals and nursing note reviewed.  Constitutional:      General: She is not in acute distress.    Appearance: Normal appearance.  HENT:     Head: Normocephalic and atraumatic.  Neck:     Vascular: No carotid bruit.  Cardiovascular:     Rate and Rhythm: Normal rate and regular rhythm.     Pulses: Normal pulses.     Heart sounds: Normal heart sounds. No murmur heard. Pulmonary:     Effort: Pulmonary effort is normal.     Breath sounds: Normal breath sounds.  Abdominal:     General: Bowel sounds are normal.  Musculoskeletal:     Right lower leg: No edema.     Left lower leg: No edema.  Skin:    General: Skin is warm and dry.  Neurological:     Mental Status: She is alert.    Medications and allergies   Allergies  Allergen Reactions   Lactose Intolerance (Gi)    Penicillins Itching    Has patient had a PCN reaction causing immediate rash, facial/tongue/throat swelling, SOB or lightheadedness with hypotension: No Has patient had a PCN reaction causing severe rash involving mucus membranes or skin necrosis: No Has patient had a PCN reaction that required hospitalization: No Has patient had a PCN reaction occurring within the last 10 years: No If all of the above answers are "NO", then may proceed with Cephalosporin use.      Medication list after today's encounter   Current Outpatient Medications:    aspirin EC 81 MG tablet, Take 81 mg by mouth daily. Swallow whole., Disp: , Rfl:    nitroGLYCERIN (NITROSTAT) 0.4 MG SL tablet, Place 1 tablet (0.4 mg total) under the tongue every 5 (five) minutes as needed for chest pain., Disp: 90 tablet, Rfl: 3   rosuvastatin (CRESTOR) 5 MG tablet, Take 1 tablet (5 mg total) by mouth at bedtime., Disp: 30 tablet, Rfl: 2  Laboratory examination:   Lab Results   Component Value Date   NA 143 08/10/2018   K 3.6 08/10/2018   CO2 24 08/10/2018   GLUCOSE 122 (H) 08/10/2018   BUN 10 08/10/2018   CREATININE 0.67 08/10/2018   CALCIUM 8.2 (L) 08/10/2018   GFRNONAA >60 08/10/2018       Latest Ref Rng & Units 08/10/2018    3:59 AM 08/09/2018    4:28 AM 08/08/2018    2:19 PM  CMP  Glucose  70 - 99 mg/dL 122  216  104   BUN 8 - 23 mg/dL _0 Creatinine 0.44 - 1.00 mg/dL 0.67  0.76  0.86   Sodium 135 - 145 mmol/L 143  139  137   Potassium 3.5 - 5.1 mmol/L 3.6  3.6  3.6   Chloride 98 - 111 mmol/L 113  109  103   CO2 22 - 32 mmol/L _1 Calcium 8.9 - 10.3 mg/dL 8.2  8.2  8.8       Latest Ref Rng & Units 08/11/2018    4:29 AM 08/10/2018    3:59 AM 08/09/2018    4:28 AM  CBC  WBC 3.6 - 11.0 K/uL 6.6  7.4  9.0   Hemoglobin 12.0 - 16.0 g/dL 9.0  8.6  9.8   Hematocrit 35.0 - 47.0 % 25.4  24.5  27.5   Platelets 150 - 440 K/uL 208  182  190     Lipid Panel No results for input(s): "CHOL", "TRIG", "[place]", "VLDL", "HDL", "CHOLHDL", "LDLDIRECT" in the last 8760 hours.  HEMOGLOBIN A1C Lab Results  Component Value Date   HGBA1C 5.2 08/09/2018   MPG 102.54 08/09/2018   TSH No results for input(s): "TSH" in the last 8760 hours.  External labs:   07/23/2022 Total cholesterol 192 HDL 91 TG's 59 LDL 87  Creatining 0.77, eGFR 86, K+ 4.6  Radiology:    Cardiac Studies:    09/05/2022 CT chest: mild cardiac enlargement with coronary and aortic atherosclerosis    EKG:   09/26/22 normal sinus rhythm, normal axis, no evidence of ischemia  Assessment     ICD-10-CM   1. Palpitations  R00.2 EKG 12-Lead    CT CORONARY MORPH W/CTA COR W/SCORE W/CA W/CM &/OR WO/CM    PCV ECHOCARDIOGRAM COMPLETE    PCV MYOCARDIAL PERFUSION WO LEXISCAN    2. Coronary artery calcification  I25.10 CT CORONARY MORPH W/CTA COR W/SCORE W/CA W/CM &/OR WO/CM   I25.84 PCV ECHOCARDIOGRAM COMPLETE    PCV MYOCARDIAL PERFUSION WO LEXISCAN    3. Other  emphysema ([place])  J43.8 CT CORONARY MORPH W/CTA COR W/SCORE W/CA W/CM &/OR WO/CM    PCV ECHOCARDIOGRAM COMPLETE    PCV MYOCARDIAL PERFUSION WO LEXISCAN    4. Angina pectoris ([facility])  I20.9 PCV ECHOCARDIOGRAM COMPLETE    PCV MYOCARDIAL PERFUSION WO LEXISCAN    5. DOE (dyspnea on exertion)  R06.09 PCV ECHOCARDIOGRAM COMPLETE    PCV MYOCARDIAL PERFUSION WO LEXISCAN       Orders Placed This Encounter  Procedures   CT CORONARY MORPH W/CTA COR W/SCORE W/CA W/CM &/OR WO/CM    Standing Status:   Future    Standing Expiration Date:   12/26/2022    Order Specific Question:   If indicated for the ordered procedure, I authorize the administration of contrast media per Radiology protocol    Answer:   Yes    Order Specific Question:   Preferred Imaging Location?    Answer:   [facility]   PCV MYOCARDIAL PERFUSION WO LEXISCAN    Standing Status:   Future    Standing Expiration Date:   11/26/2022   EKG 12-Lead   PCV ECHOCARDIOGRAM COMPLETE    Standing Status:   Future    Standing Expiration Date:   09/27/2023    Meds ordered this encounter  Medications   rosuvastatin (CRESTOR) 5 MG tablet    Sig: Take 1 tablet (5  mg total) by mouth at bedtime.    Dispense:  30 tablet    Refill:  2   nitroGLYCERIN (NITROSTAT) 0.4 MG SL tablet    Sig: Place 1 tablet (0.4 mg total) under the tongue every 5 (five) minutes as needed for chest pain.    Dispense:  90 tablet    Refill:  3    Medications Discontinued During This Encounter  Medication Reason   acetaminophen (TYLENOL) 500 MG tablet    oxybutynin (DITROPAN-XL) 10 MG 24 hr tablet    sulfamethoxazole-trimethoprim (BACTRIM DS,SEPTRA DS) 800-160 MG tablet    tamsulosin (FLOMAX) 0.4 MG CAPS capsule    tamsulosin (FLOMAX) 0.4 MG CAPS capsule      Recommendations:   Brittney Santos is a 66 y.o.  female with coronary calcification noted on CT scan of the chest who is here to establish care with cardiology.  Recently she has been having dyspnea on  exertion, chest pressure and pain with activity, and overall fatigue.  Patient is generally very active however she has not been able to do her normal activity recently.  She was started on baby aspirin by her primary care doctor.  She is reluctant to try a statin however this would benefit her given the aortic and coronary calcification noted on CT scan.  Patient is agreeable to coronary CTA to delineate the amount of calcification and plaque that may be in her coronary arteries.  Her risk factors include strong family history in addition to tobacco dependence.  Patient has never had an echocardiogram or stress test in the past.  Will order this in addition to coronary CT given her anginal symptoms.  Continue baby aspirin Start low dose Crestor for coronary calcification SL nitro provided for chest pressure and DOE Encourage low-sodium diet, less than 2000 mg daily. Schedule imaging tests in office - echo and stress test. Follow-up in 1-2 months or sooner if needed.     Brittney Custovic, DO, FACC  09/26/2022, 11:29 AM Office: [phone] Pager: [phone]   

## 2022-10-05 ENCOUNTER — Other Ambulatory Visit: Payer: Self-pay | Admitting: Cardiology

## 2022-10-05 DIAGNOSIS — I209 Angina pectoris, unspecified: Secondary | ICD-10-CM

## 2022-10-05 DIAGNOSIS — I2510 Atherosclerotic heart disease of native coronary artery without angina pectoris: Secondary | ICD-10-CM

## 2022-10-05 MED ORDER — METOPROLOL TARTRATE 25 MG PO TABS: 25.0000 mg | ORAL_TABLET | Freq: Two times a day (BID) | ORAL | 0 refills | Status: DC

## 2022-10-05 NOTE — Progress Notes (Signed)
Ordering BMP before CCTA.  Lopressor 25 mg p.o. twice daily.  Hold if systolic blood pressure (top number) less than 100 mmHg or pulse less than 60 bpm. Start 1 week before his coronary CTA.  On the day of the coronary CTA take the dose 2 hours prior to testing.  Chalmer Zheng Dunlap, DO, [facility]

## 2022-10-08 ENCOUNTER — Ambulatory Visit: Payer: Medicare Other

## 2022-10-08 DIAGNOSIS — J438 Other emphysema: Secondary | ICD-10-CM

## 2022-10-08 DIAGNOSIS — I2510 Atherosclerotic heart disease of native coronary artery without angina pectoris: Secondary | ICD-10-CM

## 2022-10-08 DIAGNOSIS — R0609 Other forms of dyspnea: Secondary | ICD-10-CM

## 2022-10-08 DIAGNOSIS — R002 Palpitations: Secondary | ICD-10-CM

## 2022-10-08 DIAGNOSIS — I209 Angina pectoris, unspecified: Secondary | ICD-10-CM

## 2022-10-09 ENCOUNTER — Ambulatory Visit: Payer: Medicare Other

## 2022-10-09 DIAGNOSIS — R0609 Other forms of dyspnea: Secondary | ICD-10-CM

## 2022-10-09 DIAGNOSIS — R002 Palpitations: Secondary | ICD-10-CM

## 2022-10-09 DIAGNOSIS — J438 Other emphysema: Secondary | ICD-10-CM

## 2022-10-09 DIAGNOSIS — I209 Angina pectoris, unspecified: Secondary | ICD-10-CM

## 2022-10-09 DIAGNOSIS — I2510 Atherosclerotic heart disease of native coronary artery without angina pectoris: Secondary | ICD-10-CM

## 2022-10-11 LAB — BASIC METABOLIC PANEL
BUN/Creatinine Ratio: 16 (ref 12–28)
BUN: 13 mg/dL (ref 8–27)
CO2: 21 mmol/L (ref 20–29)
Calcium: 9.5 mg/dL (ref 8.7–10.3)
Chloride: 102 mmol/L (ref 96–106)
Creatinine, Ser: 0.81 mg/dL (ref 0.57–1.00)
Glucose: 104 mg/dL — ABNORMAL HIGH (ref 70–99)
Potassium: 4.3 mmol/L (ref 3.5–5.2)
Sodium: 143 mmol/L (ref 134–144)
eGFR: 81 mL/min/{1.73_m2} (ref >59)

## 2022-10-11 NOTE — Progress Notes (Signed)
Thank you :)

## 2022-10-19 ENCOUNTER — Telehealth (HOSPITAL_COMMUNITY): Payer: Self-pay | Admitting: *Deleted

## 2022-10-19 ENCOUNTER — Other Ambulatory Visit: Payer: Medicare Other

## 2022-10-19 NOTE — Telephone Encounter (Signed)
Patient returning call regarding upcoming cardiac imaging study; pt verbalizes understanding of appt date/time, parking situation and where to check in, medications ordered, and verified current allergies; name and call back number provided for further questions should they arise  Gordy Clement RN Navigator Cardiac Imaging [facility] [phone] office [phone] cell  Patient to take 25mg  metoprolol tartrate two hours prior to her cardiac CT scan.  She is aware to arrive at 8:30am.

## 2022-10-19 NOTE — Telephone Encounter (Signed)
Attempted to call patient regarding upcoming cardiac CT appointment. Voicemail full and unable to leave a message.  Anh Mangano RN Navigator Cardiac Imaging [facility] [phone] Office [phone] Cell  

## 2022-10-22 ENCOUNTER — Ambulatory Visit (HOSPITAL_COMMUNITY)
Admission: RE | Admit: 2022-10-22 | Discharge: 2022-10-22 | Disposition: A | Discharge status: Home / Self Care | Payer: Medicare Other | Source: Ambulatory Visit | Attending: Internal Medicine | Admitting: Internal Medicine

## 2022-10-22 DIAGNOSIS — R002 Palpitations: Secondary | ICD-10-CM

## 2022-10-22 DIAGNOSIS — J438 Other emphysema: Secondary | ICD-10-CM

## 2022-10-22 DIAGNOSIS — I2510 Atherosclerotic heart disease of native coronary artery without angina pectoris: Secondary | ICD-10-CM | POA: Diagnosis present

## 2022-10-22 DIAGNOSIS — I2584 Coronary atherosclerosis due to calcified coronary lesion: Secondary | ICD-10-CM | POA: Insufficient documentation

## 2022-10-22 MED ORDER — IOHEXOL 350 MG/ML SOLN
100.0000 mL | Freq: Once | INTRAVENOUS | Status: AC | PRN
  Administered 2022-10-22: 100 mL via INTRAVENOUS

## 2022-10-22 MED ORDER — NITROGLYCERIN 0.4 MG SL SUBL
0.8000 mg | SUBLINGUAL_TABLET | Freq: Once | SUBLINGUAL | Status: AC
  Administered 2022-10-22: 0.8 mg via SUBLINGUAL

## 2022-10-22 MED ORDER — NITROGLYCERIN 0.4 MG SL SUBL
SUBLINGUAL_TABLET | SUBLINGUAL | Status: AC
  Filled 2022-10-22: qty 2

## 2022-11-07 ENCOUNTER — Ambulatory Visit: Payer: Medicare Other | Admitting: Internal Medicine

## 2022-11-07 ENCOUNTER — Encounter: Payer: Self-pay | Admitting: Internal Medicine

## 2022-11-07 VITALS — BP 112/64 | HR 67 | Resp 16 | Ht 66.0 in | Wt 129.4 lb

## 2022-11-07 DIAGNOSIS — R0609 Other forms of dyspnea: Secondary | ICD-10-CM

## 2022-11-07 DIAGNOSIS — I951 Orthostatic hypotension: Secondary | ICD-10-CM

## 2022-11-07 NOTE — Progress Notes (Signed)
Primary Physician/Referring:  Jilda Panda, MD  Patient ID: Brittney Santos, female    DOB: 07-22-1956, 66 y.o.   MRN: [id number]  Chief Complaint  Patient presents with   Follow-up    1 month f/u   Dizziness    HPI:    Brittney Santos  is a 66 y.o. female with shortness of breath who is here for a follow-up visit. She had a stress test, echocardiogram, and coronary CT scan since our last visit and they all came back normal. Patient is still experiencing dizziness and shortness of breath. Today, her orthostatic vital signs were positive and I encouraged her to drink more water. Patient is a smoker but she has never seen a lunch doctor before. She would like a referral for this. She denies chest pain, diaphoresis, syncope, edema, orthopnea, PND, claudication.   Past Medical History:  Diagnosis Date   Anal condyloma    Arthritis    Chronic diarrhea    S/P  CHOLECYSTECTOMY   GERD (gastroesophageal reflux disease)    WATCHES DIET   History of basal cell carcinoma excision    X2   FROM  NOSE   History of cervical cancer    DX  1989--  S/P  TAH WITH BSO IN 1999   History of colon polyps    History of kidney stones    Renal calculus, left    2011   Past Surgical History:  Procedure Laterality Date   ABDOMINAL HYSTERECTOMY     CERVICAL CONIZATION W/BX  1998   CHOLECYSTECTOMY N/A 12/29/2013   Procedure: LAPAROSCOPIC CHOLECYSTECTOMY;  Surgeon: Stark Klein, MD;  Location: [facility];  Service: General;  Laterality: N/A;   COLONOSCOPY W/ POLYPECTOMY  X2   LAST ONE  NOV  2014   CYSTOSCOPY WITH RETROGRADE PYELOGRAM, URETEROSCOPY AND STENT PLACEMENT Left 08/08/2018   Procedure: CYSTOSCOPY WITH RETROGRADE PYELOGRAM, URETEROSCOPY AND STENT PLACEMENT;  Surgeon: Festus Aloe, MD;  Location: [facility];  Service: Urology;  Laterality: Left;   EXTRACORPOREAL SHOCK WAVE LITHOTRIPSY  1999   EXTRACORPOREAL SHOCK WAVE LITHOTRIPSY Left 08/28/2018   Procedure: EXTRACORPOREAL SHOCK WAVE LITHOTRIPSY  (ESWL);  Surgeon: Abbie Sons, MD;  Location: [facility];  Service: Urology;  Laterality: Left;   EXTRACORPOREAL SHOCK WAVE LITHOTRIPSY Left 10/02/2018   Procedure: EXTRACORPOREAL SHOCK WAVE LITHOTRIPSY (ESWL);  Surgeon: Billey Co, MD;  Location: [facility];  Service: Urology;  Laterality: Left;   LASER ABLATION CONDOLAMATA N/A 06/16/2014   Procedure: ANAL EXAM UNDER ANESTHESIA,  LASER ABLATION OF ANAL CONDOLAMATA;  Surgeon: Leighton Ruff, MD;  Location: [place];  Service: General;  Laterality: N/A;   MOHS SURGERY  X2  LAST ONE 2011   TOTAL ABDOMINAL HYSTERECTOMY W/ BILATERAL SALPINGOOPHORECTOMY  1999   Family History  Problem Relation Age of Onset   Cancer Mother        uterus   Heart failure Father    Diabetes Father    Diabetes Brother    Breast cancer Paternal Aunt    Cancer Paternal Aunt        breast   Bladder Cancer Neg Hx    Kidney cancer Neg Hx     Social History   Tobacco Use   Smoking status: Former    Packs/day: 1.00    Years: 38.00    Total pack years: 38.00    Types: Cigarettes    Quit date: 01/01/2012    Years since quitting: 10.8   Smokeless tobacco: Never  Substance Use  Topics   Alcohol use: Yes    Comment: occasionally   Marital Status: Divorced  ROS  Review of Systems  Constitutional: Positive for malaise/fatigue.  Cardiovascular:  Positive for dyspnea on exertion and palpitations. Negative for chest pain, claudication and irregular heartbeat.  Neurological:  Positive for dizziness and light-headedness.   Objective  Blood pressure 112/64, pulse 67, resp. rate 16, height _0  (1.676 m), weight 129 lb 6.4 oz (58.7 kg), SpO2 100 %. Body mass index is 20.89 kg/m.     11/07/2022    8:18 AM 10/22/2022    9:20 AM 10/22/2022    8:50 AM  Vitals with BMI  Height _1     Weight 129 lbs 6 oz    BMI 55.9    Systolic [phone]  Diastolic 64 62 61  Pulse 67 58 56     Physical Exam Vitals and nursing note reviewed.   Constitutional:      General: She is not in acute distress.    Appearance: Normal appearance.  HENT:     Head: Normocephalic and atraumatic.  Neck:     Vascular: No carotid bruit.  Cardiovascular:     Rate and Rhythm: Normal rate and regular rhythm.     Pulses: Normal pulses.     Heart sounds: Normal heart sounds. No murmur heard. Pulmonary:     Effort: Pulmonary effort is normal.     Breath sounds: Normal breath sounds.  Abdominal:     General: Bowel sounds are normal.  Musculoskeletal:     Right lower leg: No edema.     Left lower leg: No edema.  Skin:    General: Skin is warm and dry.  Neurological:     Mental Status: She is alert.    Medications and allergies   Allergies  Allergen Reactions   Lactose Intolerance (Gi)    Penicillins Itching    Has patient had a PCN reaction causing immediate rash, facial/tongue/throat swelling, SOB or lightheadedness with hypotension: No Has patient had a PCN reaction causing severe rash involving mucus membranes or skin necrosis: No Has patient had a PCN reaction that required hospitalization: No Has patient had a PCN reaction occurring within the last 10 years: No If all of the above answers are "NO", then may proceed with Cephalosporin use.      Medication list after today's encounter   Current Outpatient Medications:    aspirin EC 81 MG tablet, Take 81 mg by mouth daily. Swallow whole., Disp: , Rfl:    nitroGLYCERIN (NITROSTAT) 0.4 MG SL tablet, Place 1 tablet (0.4 mg total) under the tongue every 5 (five) minutes as needed for chest pain., Disp: 90 tablet, Rfl: 3   rosuvastatin (CRESTOR) 5 MG tablet, Take 1 tablet (5 mg total) by mouth at bedtime., Disp: 30 tablet, Rfl: 2  Laboratory examination:   Lab Results  Component Value Date   NA 143 10/08/2022   K 4.3 10/08/2022   CO2 21 10/08/2022   GLUCOSE 104 (H) 10/08/2022   BUN 13 10/08/2022   CREATININE 0.81 10/08/2022   CALCIUM 9.5 10/08/2022   EGFR 81 10/08/2022    GFRNONAA >60 08/10/2018       Latest Ref Rng & Units 10/08/2022    9:36 AM 08/10/2018    3:59 AM 08/09/2018    4:28 AM  CMP  Glucose 70 - 99 mg/dL 104  122  216   BUN 8 - 27 mg/dL 13  10  13  Creatinine 0.57 - 1.00 mg/dL 0.81  0.67  0.76   Sodium 134 - 144 mmol/L 143  143  139   Potassium 3.5 - 5.2 mmol/L 4.3  3.6  3.6   Chloride 96 - 106 mmol/L 102  113  109   CO2 20 - 29 mmol/L _0 Calcium 8.7 - 10.3 mg/dL 9.5  8.2  8.2       Latest Ref Rng & Units 08/11/2018    4:29 AM 08/10/2018    3:59 AM 08/09/2018    4:28 AM  CBC  WBC 3.6 - 11.0 K/uL 6.6  7.4  9.0   Hemoglobin 12.0 - 16.0 g/dL 9.0  8.6  9.8   Hematocrit 35.0 - 47.0 % 25.4  24.5  27.5   Platelets 150 - 440 K/uL 208  182  190     Lipid Panel No results for input(s): "CHOL", "TRIG", "[place]", "VLDL", "HDL", "CHOLHDL", "LDLDIRECT" in the last 8760 hours.  HEMOGLOBIN A1C Lab Results  Component Value Date   HGBA1C 5.2 08/09/2018   MPG 102.54 08/09/2018   TSH No results for input(s): "TSH" in the last 8760 hours.  External labs:   07/23/2022 Total cholesterol 192 HDL 91 TG's 59 LDL 87  Creatining 0.77, eGFR 86, K+ 4.6  Radiology:   09/05/2022 CT chest: mild cardiac enlargement with coronary and aortic atherosclerosis  Cardiac Studies:   Echocardiogram 10/08/2022: Normal LV systolic function with visual EF 55-60%. Left ventricle cavity is normal in size. Normal global wall motion. Normal diastolic filling pattern, normal LAP. Calculated EF 58%. Structurally normal mitral valve.  Mild to moderate mitral regurgitation. Structurally normal tricuspid valve with no regurgitation. No evidence of pulmonary hypertension. Structurally normal pulmonic valve.  Mild pulmonic regurgitation. No prior available for comparison.    Exercise nuclear stress test 10/09/2022: Myocardial perfusion is normal. Overall LV systolic function is normal without regional wall motion abnormalities. Stress LV EF: 68%.  Normal  ECG stress.  PVC during entire stress, less at peak exercise. The heart rate response was accelerated. The blood pressure response was normal. The patient exercised for 2 minutes and 56 seconds of a Bruce protocol, achieving 89% MPHR. Reduced exercise tolerance.   No previous exam available for comparison. Low risk.     10/22/2022 CCTA IMPRESSION: 1. Total coronary calcium score of 0. 2. Normal coronary origin with co-dominance. 3. CAD-RADS = 0 but myocardial bridge within the mid-LAD cannot be ruled out. 4.  Pectus excavatum deformity present.    EKG:   09/26/22 normal sinus rhythm, normal axis, no evidence of ischemia  Assessment     ICD-10-CM   1. DOE (dyspnea on exertion)  R06.09 Ambulatory referral to Pulmonology    2. Orthostatic hypotension  I95.1        Orders Placed This Encounter  Procedures   Ambulatory referral to Pulmonology    Referral Priority:   Routine    Referral Type:   Consultation    Referral Reason:   Specialty Services Required    Requested Specialty:   Pulmonary Disease    Number of Visits Requested:   1    No orders of the defined types were placed in this encounter.   Medications Discontinued During This Encounter  Medication Reason   metoprolol tartrate (LOPRESSOR) 25 MG tablet      Recommendations:   Cleopatra Sardo is a 66 y.o.  female with shortness of breath   DOE (dyspnea on exertion) Stress test, echo,  and CCTA within normal limits Will give patient referral for pulmonology   Orthostatic hypotension Encourage increased water intake Recommend compression stockings Change positions slowly Limit caffeine intake    Follow-up in 12 months or sooner if needed.     Floydene Flock, DO, [facility]  11/07/2022, 9:16 AM Office: [phone] Pager: [phone]

## 2022-12-04 NOTE — Progress Notes (Unsigned)
Synopsis: Referred in December 2023 for dyspnea, noted to have centrilobular emphysema on a CT chest. Former cigarette smoker.  Formerly smoked 1ppd for 39 years.  Quit in 2013.   Subjective:   PATIENT ID: Brittney Santos GENDER: female DOB: Mar 31, 1956, MRN: [id number]   HPI  Chief Complaint  Patient presents with   Consult    DOE, ct 09/05/22. Been going on for 6 months.     Brittney Santos quit smoking in 2013 and has been undergoing lung cancer screening that showed emphysema on a CT scan.    She says that back in the summer she had heaviness, lack of strength and felt like she was "wearing ankle weights".  She says that this occurred after her CT chest.  She didn't have a preceding illness that she can recall (no viral symptoms, no fever, etc).  She felt like she went from "90 to 0" and this lasted 10 days.  She says that she could barely walk due to fatigue and dyspnea.  She would have to hold on to the wall to support herself. She had more tightness than dyspnea.  She felt like the tightness feeling was coming from the right side of her chest.  She denied fever, no wheezing, no coughing.   Now she says that she will have some shortness of breath when she climbs a flight of stairs several times.  She never has to stop exercising but she will feel short of breath while moving.    She says that she has checked her oxygen many times while walking with an oximeter and it's normal.    She hasn't had frequent bronchitis or pneumonia.  She recalls an episode of pleurisy in 2017.  She had COVID in 2022, but when she felt the above listed symptoms she doesn't believe she had a viral infection.   Record review: Cardiology visit from November 2023 reviewed where the patient was seen after she had had a normal stress test, normal echocardiogram and normal calcium scoring.  Referred device for dyspnea on exertion.  Past Medical History:  Diagnosis Date   Anal condyloma    Arthritis    Chronic diarrhea     S/P  CHOLECYSTECTOMY   GERD (gastroesophageal reflux disease)    WATCHES DIET   History of basal cell carcinoma excision    X2   FROM  NOSE   History of cervical cancer    DX  1989--  S/P  TAH WITH BSO IN 1999   History of colon polyps    History of kidney stones    Renal calculus, left    2011     Family History  Problem Relation Age of Onset   Cancer Mother        uterus   Heart failure Father    Diabetes Father    Diabetes Brother    Breast cancer Paternal Aunt    Cancer Paternal Aunt        breast   Bladder Cancer Neg Hx    Kidney cancer Neg Hx      Social History   Socioeconomic History   Marital status: Divorced    Spouse name: Not on file   Number of children: 1   Years of education: Not on file   Highest education level: Not on file  Occupational History   Not on file  Tobacco Use   Smoking status: Former    Packs/day: 1.00    Years: 38.00    Total pack  years: 38.00    Types: Cigarettes    Quit date: 01/01/2012    Years since quitting: 10.9   Smokeless tobacco: Never  Vaping Use   Vaping Use: Some days  Substance and Sexual Activity   Alcohol use: Yes    Comment: occasionally   Drug use: No   Sexual activity: Not on file  Other Topics Concern   Not on file  Social History Narrative   Not on file   Social Determinants of Health   Financial Resource Strain: Not on file  Food Insecurity: Not on file  Transportation Needs: Not on file  Physical Activity: Not on file  Stress: Not on file  Social Connections: Not on file  Intimate Partner Violence: Not on file     Allergies  Allergen Reactions   Lactose Intolerance (Gi)    Penicillins Itching    Has patient had a PCN reaction causing immediate rash, facial/tongue/throat swelling, SOB or lightheadedness with hypotension: No Has patient had a PCN reaction causing severe rash involving mucus membranes or skin necrosis: No Has patient had a PCN reaction that required hospitalization: No Has  patient had a PCN reaction occurring within the last 10 years: No If all of the above answers are "NO", then may proceed with Cephalosporin use.      Outpatient Medications Prior to Visit  Medication Sig Dispense Refill   aspirin EC 81 MG tablet Take 81 mg by mouth daily. Swallow whole. (Patient not taking: Reported on 12/06/2022)     nitroGLYCERIN (NITROSTAT) 0.4 MG SL tablet Place 1 tablet (0.4 mg total) under the tongue every 5 (five) minutes as needed for chest pain. (Patient not taking: Reported on 12/06/2022) 90 tablet 3   rosuvastatin (CRESTOR) 5 MG tablet Take 1 tablet (5 mg total) by mouth at bedtime. (Patient not taking: Reported on 12/06/2022) 30 tablet 2   No facility-administered medications prior to visit.    Review of Systems  Constitutional:  Negative for chills, fever, malaise/fatigue and weight loss.  HENT:  Negative for congestion, nosebleeds, sinus pain and sore throat.   Eyes:  Negative for photophobia, pain and discharge.  Respiratory:  Positive for shortness of breath. Negative for cough, hemoptysis, sputum production and wheezing.   Cardiovascular:  Negative for chest pain, palpitations, orthopnea and leg swelling.  Gastrointestinal:  Negative for abdominal pain, constipation, diarrhea, nausea and vomiting.  Genitourinary:  Negative for dysuria, frequency, hematuria and urgency.  Musculoskeletal:  Negative for back pain, joint pain, myalgias and neck pain.  Skin:  Negative for itching and rash.  Neurological:  Negative for tingling, tremors, sensory change, speech change, focal weakness, seizures, weakness and headaches.  Psychiatric/Behavioral:  Negative for memory loss, substance abuse and suicidal ideas. The patient is not nervous/anxious.       Objective:  Physical Exam   Vitals:   12/06/22 0955  BP: 90/66  Pulse: 71  Temp: 97.7 F (36.5 C)  TempSrc: Oral  SpO2: 98%  Weight: 130 lb 6.4 oz (59.1 kg)  Height: 5\' 5"  (1.651 m)    Gen: well appearing,  no acute distress HENT: NCAT, OP clear, neck supple without masses Eyes: PERRL, EOMi Lymph: no cervical lymphadenopathy PULM: CTA B CV: RRR, no mgr, no JVD GI: BS+, soft, nontender, no hsm Derm: no rash or skin breakdown MSK: normal bulk and tone Neuro: A&Ox4, CN II-XII intact, strength 5/5 in all 4 extremities Psyche: normal mood and affect   CBC    Component Value Date/Time  WBC 6.6 08/11/2018 0429   RBC 2.85 (L) 08/11/2018 0429   HGB 9.0 (L) 08/11/2018 0429   HCT 25.4 (L) 08/11/2018 0429   PLT 208 08/11/2018 0429   MCV 89.0 08/11/2018 0429   MCH 31.5 08/11/2018 0429   MCHC 35.4 08/11/2018 0429   RDW 12.4 08/11/2018 0429   LYMPHSABS 1.7 12/18/2013 0920   MONOABS 0.5 12/18/2013 0920   EOSABS 0.2 12/18/2013 0920   BASOSABS 0.0 12/18/2013 0920     Chest imaging: 2023 lung cancer screening CT scan images independently reviewed showing upper lobe predominant centrilobular emphysema, RADS 2  PFT:  Labs:  Path:  Echo: October 2023 normal LVEF, normal RV size and function, mild MR, mild TR  Heart Catheterization:   Stress test: October 2023 nuclear stress test LVEF 68%, normal perfusion    Assessment & Plan:   Centrilobular emphysema (HCC) - Plan: Pulmonary function test, Alpha-1 antitrypsin phenotype  Ex-cigarette smoker  Vaping nicotine dependence, non-tobacco product  Pulmonary nodule less than 6 mm determined by computed tomography of lung  Discussion: Pleasant 66 year old female found to have centrilobular emphysema on a low-dose screening CT scan of the chest earlier this year.  She describes some significant fatigue around the time that she had the CT scan of the chest but I believe that the 2 are not related because she received such a low-dose of radiation.  I think it is likely she had a viral infection of some sort around the time that she had the CT scan.  She describes some shortness of breath so we need to get a lung function test to evaluate for  COPD.  She has a pulmonary nodule which needs to be followed with an annual CT scan.  Centrilobular emphysema: As discussed today this is due to prior tobacco use We will get a lung function test to assess for COPD Practice good hand hygiene Stay active Flu shot today Pneumonia vaccine next visit Check alpha 1 anti-trypsin  Pulmonary nodule: I agree with the recommendation to get another CT scan next year  Vaping: Stop  We will see you back in 4 to 6 weeks to go over the results of the lung function test or sooner if needed.  Immunizations: Immunization History  Administered Date(s) Administered   [employer] Vaccination 04/13/2020     Current Outpatient Medications:    aspirin EC 81 MG tablet, Take 81 mg by mouth daily. Swallow whole. (Patient not taking: Reported on 12/06/2022), Disp: , Rfl:    nitroGLYCERIN (NITROSTAT) 0.4 MG SL tablet, Place 1 tablet (0.4 mg total) under the tongue every 5 (five) minutes as needed for chest pain. (Patient not taking: Reported on 12/06/2022), Disp: 90 tablet, Rfl: 3   rosuvastatin (CRESTOR) 5 MG tablet, Take 1 tablet (5 mg total) by mouth at bedtime. (Patient not taking: Reported on 12/06/2022), Disp: 30 tablet, Rfl: 2

## 2022-12-06 ENCOUNTER — Ambulatory Visit (INDEPENDENT_AMBULATORY_CARE_PROVIDER_SITE_OTHER): Payer: Medicare Other | Admitting: Pulmonary Disease

## 2022-12-06 ENCOUNTER — Encounter: Payer: Self-pay | Admitting: Pulmonary Disease

## 2022-12-06 VITALS — BP 90/66 | HR 71 | Temp 97.7°F | Ht 65.0 in | Wt 130.4 lb

## 2022-12-06 DIAGNOSIS — F17200 Nicotine dependence, unspecified, uncomplicated: Secondary | ICD-10-CM | POA: Diagnosis not present

## 2022-12-06 DIAGNOSIS — Z87891 Personal history of nicotine dependence: Secondary | ICD-10-CM | POA: Diagnosis not present

## 2022-12-06 DIAGNOSIS — Z23 Encounter for immunization: Secondary | ICD-10-CM

## 2022-12-06 DIAGNOSIS — J432 Centrilobular emphysema: Secondary | ICD-10-CM | POA: Diagnosis not present

## 2022-12-06 DIAGNOSIS — R911 Solitary pulmonary nodule: Secondary | ICD-10-CM | POA: Diagnosis not present

## 2022-12-06 DIAGNOSIS — I209 Angina pectoris, unspecified: Secondary | ICD-10-CM | POA: Diagnosis not present

## 2022-12-06 NOTE — Patient Instructions (Signed)
Centrilobular emphysema: As discussed today this is due to prior tobacco use We will get a lung function test to assess for COPD Practice good hand hygiene Stay active Flu shot today Pneumonia vaccine next visit  Pulmonary nodule: I agree with the recommendation to get another CT scan next year  Vaping: Stop  We will see you back in 4 to 6 weeks to go over the results of the lung function test or sooner if needed.

## 2022-12-14 LAB — ALPHA-1 ANTITRYPSIN PHENOTYPE: A-1 Antitrypsin, Ser: 133 mg/dL (ref 83–199)

## 2023-01-22 ENCOUNTER — Ambulatory Visit (INDEPENDENT_AMBULATORY_CARE_PROVIDER_SITE_OTHER): Payer: Medicare Other | Admitting: Pulmonary Disease

## 2023-01-22 ENCOUNTER — Encounter: Payer: Medicare Other | Admitting: Primary Care

## 2023-01-22 DIAGNOSIS — J432 Centrilobular emphysema: Secondary | ICD-10-CM | POA: Diagnosis not present

## 2023-01-22 LAB — PULMONARY FUNCTION TEST
DL/VA % pred: 76 %
DL/VA: 3.15 ml/min/mmHg/L
DLCO cor % pred: 59 %
DLCO cor: 12.14 ml/min/mmHg
DLCO unc % pred: 59 %
DLCO unc: 12.14 ml/min/mmHg
FEF 25-75 Post: 1.47 L/sec
FEF 25-75 Pre: 1.22 L/sec
FEF2575-%Change-Post: 20 %
FEF2575-%Pred-Post: 67 %
FEF2575-%Pred-Pre: 55 %
FEV1-%Change-Post: 1 %
FEV1-%Pred-Post: 69 %
FEV1-%Pred-Pre: 68 %
FEV1-Post: 1.75 L
FEV1-Pre: 1.72 L
FEV1FVC-%Change-Post: -1 %
FEV1FVC-%Pred-Pre: 95 %
FEV6-%Change-Post: 2 %
FEV6-%Pred-Post: 76 %
FEV6-%Pred-Pre: 74 %
FEV6-Post: 2.4 L
FEV6-Pre: 2.34 L
FEV6FVC-%Change-Post: 0 %
FEV6FVC-%Pred-Post: 102 %
FEV6FVC-%Pred-Pre: 103 %
FVC-%Change-Post: 3 %
FVC-%Pred-Post: 73 %
FVC-%Pred-Pre: 71 %
FVC-Post: 2.43 L
FVC-Pre: 2.35 L
Post FEV1/FVC ratio: 72 %
Post FEV6/FVC ratio: 99 %
Pre FEV1/FVC ratio: 73 %
Pre FEV6/FVC Ratio: 100 %
RV % pred: 104 %
RV: 2.23 L
TLC % pred: 90 %
TLC: 4.72 L

## 2023-01-22 NOTE — Patient Instructions (Signed)
Full PFT performed today.

## 2023-01-22 NOTE — Progress Notes (Signed)
Full PFT performed today.

## 2023-02-07 ENCOUNTER — Other Ambulatory Visit: Payer: Self-pay | Admitting: *Deleted

## 2023-02-07 ENCOUNTER — Ambulatory Visit (INDEPENDENT_AMBULATORY_CARE_PROVIDER_SITE_OTHER): Payer: Medicare Other | Admitting: Pulmonary Disease

## 2023-02-07 ENCOUNTER — Ambulatory Visit (INDEPENDENT_AMBULATORY_CARE_PROVIDER_SITE_OTHER): Payer: Medicare Other

## 2023-02-07 ENCOUNTER — Encounter: Payer: Self-pay | Admitting: Pulmonary Disease

## 2023-02-07 ENCOUNTER — Telehealth: Payer: Self-pay | Admitting: Pulmonary Disease

## 2023-02-07 VITALS — BP 116/60 | HR 84 | Ht 65.0 in | Wt 135.4 lb

## 2023-02-07 DIAGNOSIS — J432 Centrilobular emphysema: Secondary | ICD-10-CM | POA: Diagnosis not present

## 2023-02-07 DIAGNOSIS — R0609 Other forms of dyspnea: Secondary | ICD-10-CM

## 2023-02-07 DIAGNOSIS — F17200 Nicotine dependence, unspecified, uncomplicated: Secondary | ICD-10-CM | POA: Diagnosis not present

## 2023-02-07 DIAGNOSIS — R911 Solitary pulmonary nodule: Secondary | ICD-10-CM | POA: Diagnosis not present

## 2023-02-07 DIAGNOSIS — J0300 Acute streptococcal tonsillitis, unspecified: Secondary | ICD-10-CM

## 2023-02-07 MED ORDER — COMBIVENT RESPIMAT 20-100 MCG/ACT IN AERS: 1.0000 | INHALATION_SPRAY | Freq: Four times a day (QID) | RESPIRATORY_TRACT | 5 refills | Status: DC

## 2023-02-07 MED ORDER — BENZONATATE 200 MG PO CAPS: 200.0000 mg | ORAL_CAPSULE | Freq: Three times a day (TID) | ORAL | 1 refills | Status: DC | PRN

## 2023-02-07 NOTE — Addendum Note (Signed)
Addended by: Loma Sousa on: 02/07/2023 09:47 AM   Modules accepted: Orders

## 2023-02-07 NOTE — Progress Notes (Signed)
Synopsis: Referred in December 2023 for dyspnea, noted to have centrilobular emphysema on a CT chest. Former cigarette smoker.  Formerly smoked 1ppd for 39 years.  Quit in 2013.  Stopped vaping in 11/2022  Subjective:   PATIENT ID: Brittney Santos GENDER: female DOB: 01-25-1956, MRN: [id number]   HPI  Chief Complaint  Patient presents with   Follow-up    Pft review, pt states she hasn't been feeling well since the pft, chest tightness, headaches    On January 28 she developed headache, fever, chills, dry cough. She says that she still doesn't feel well.  She's taken salt water gargles, humidified air, tylenol, drinking plenty of fluids.  She has been taking mucinex to see if she could cough anything up, but that didn't work.  She's had a tickle in her throat that makes her cough.  She was on tylenol q6h for fever.  Now things are briefly improved and now she's on q12 hr tylenol because her symptoms have improved.  She's had some runny nose. Some shortness of breath, some chest tightness.  These symptoms were worse in the first few days.   She stopped vaping in December.   Past Medical History:  Diagnosis Date   Anal condyloma    Arthritis    Chronic diarrhea    S/P  CHOLECYSTECTOMY   GERD (gastroesophageal reflux disease)    WATCHES DIET   History of basal cell carcinoma excision    X2   FROM  NOSE   History of cervical cancer    DX  1989--  S/P  TAH WITH BSO IN 1999   History of colon polyps    History of kidney stones    Renal calculus, left    2011     Review of Systems  Constitutional:  Positive for fever and malaise/fatigue. Negative for chills and weight loss.  HENT:  Negative for congestion, sinus pain and sore throat.   Respiratory:  Positive for cough. Negative for sputum production and shortness of breath.   Cardiovascular:  Negative for chest pain and leg swelling.      Objective:  Physical Exam   Vitals:   02/07/23 0905  BP: 116/60  Pulse: 84  SpO2:  100%  Weight: 135 lb 6.4 oz (61.4 kg)  Height: 5\' 5"  (1.651 m)    Gen: well appearing HENT: OP clear, neck supple PULM: CTA B, normal effort  CV: RRR, no mgr GI: BS+, soft, nontender Derm: no cyanosis or rash Psyche: normal mood and affect    CBC    Component Value Date/Time   WBC 6.6 08/11/2018 0429   RBC 2.85 (L) 08/11/2018 0429   HGB 9.0 (L) 08/11/2018 0429   HCT 25.4 (L) 08/11/2018 0429   PLT 208 08/11/2018 0429   MCV 89.0 08/11/2018 0429   MCH 31.5 08/11/2018 0429   MCHC 35.4 08/11/2018 0429   RDW 12.4 08/11/2018 0429   LYMPHSABS 1.7 12/18/2013 0920   MONOABS 0.5 12/18/2013 0920   EOSABS 0.2 12/18/2013 0920   BASOSABS 0.0 12/18/2013 0920     Chest imaging: 2023 lung cancer screening CT scan images independently reviewed showing upper lobe predominant centrilobular emphysema, RADS 2  PFT: January 2024 ratio 72%, FVC 2.43 L 73% predicted, total lung capacity 4.72 L 90% predicted, DLCO 12.14 59% predicted  Labs: 11/2022 alpha 1> normal  Path:  Echo: October 2023 normal LVEF, normal RV size and function, mild MR, mild TR  Heart Catheterization:   Stress test: October 2023  nuclear stress test LVEF 68%, normal perfusion     Assessment & Plan:   DOE (dyspnea on exertion) - Plan: DG Chest 2 View  Vaping nicotine dependence, non-tobacco product  Centrilobular emphysema (HCC)  Pulmonary nodule less than 6 mm determined by computed tomography of lung  Discussion: [name] has emphysema, no COPD.  She has had persistent viral symptoms since January 28, likely flu or RSV. Because of the persistent cough and the presence of emphysema I think it is reasonable for her to keep a Combivent inhaler at home to use as needed.  She also has lingering cough from vocal cord irritation so we will give her Tessalon to help with that.  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 I will see you back in September after the neck screening CT scan  Follow-up in September 2024, sooner if needed  Immunizations: Immunization History  Administered Date(s) Administered   Fluad Quad(high Dose 65+) 12/06/2022   Moderna Sars-Covid-2 Vaccination 04/13/2020     Current Outpatient Medications:    benzonatate (TESSALON) 200 MG capsule, Take 1 capsule (200 mg total) by mouth 3 (three) times daily as needed for cough., Disp: 30 capsule, Rfl: 1   Ipratropium-Albuterol (COMBIVENT RESPIMAT) 20-100 MCG/ACT AERS respimat, Inhale 1 puff into the lungs every 6 (six) hours., Disp: 4 g, Rfl: 5   aspirin EC 81 MG tablet, Take 81 mg by mouth daily. Swallow whole. (Patient not taking: Reported on 12/06/2022), Disp: , Rfl:    nitroGLYCERIN (NITROSTAT) 0.4 MG SL tablet, Place 1 tablet (0.4 mg total) under the tongue every 5 (five) minutes as needed for chest pain. (Patient not taking: Reported on 12/06/2022), Disp: 90 tablet, Rfl: 3   rosuvastatin (CRESTOR) 5 MG tablet, Take 1 tablet (5 mg total) by mouth at bedtime. (Patient not taking: Reported on 12/06/2022), Disp: 30 tablet, Rfl: 2

## 2023-02-07 NOTE — Addendum Note (Signed)
Addended by: Loma Sousa on: 02/07/2023 11:32 AM   Modules accepted: Orders

## 2023-02-07 NOTE — Patient Instructions (Signed)
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

## 2023-02-07 NOTE — Addendum Note (Signed)
Addended by: Marshia Ly R on: 02/07/2023 11:30 AM   Modules accepted: Orders

## 2023-02-07 NOTE — Addendum Note (Signed)
Addended by: Suzzanne Cloud E on: 02/07/2023 10:02 AM   Modules accepted: Orders

## 2023-02-08 NOTE — Telephone Encounter (Signed)
Called and spoke with patient. She stated that the Combivent inhaler was over $500 at the pharmacy. She could not remember if the pharmacist stated if it needed a PA or not. I advised her that I would call the pharmacy to find out.   Called [facility] and spoke with Judson Roch. She reviewed the prescription and stated that the patient does not have prescription coverage for Medicare. With a [place] coupon, the price only decreased to $491.   I also checked the [place] was not listed. Patient assistance would be her only option.   Left message for patient to call back.

## 2023-02-09 LAB — COVID-19, FLU A+B AND RSV
Influenza A, NAA: NOT DETECTED
Influenza B, NAA: NOT DETECTED
RSV, NAA: NOT DETECTED
SARS-CoV-2, NAA: NOT DETECTED

## 2023-02-11 ENCOUNTER — Telehealth: Payer: Self-pay | Admitting: Pulmonary Disease

## 2023-02-11 DIAGNOSIS — R9389 Abnormal findings on diagnostic imaging of other specified body structures: Secondary | ICD-10-CM

## 2023-02-13 NOTE — Telephone Encounter (Signed)
Discussed lab results with patient. Advised of chest xray results. She verbalized understanding. Order for High resolution chest CT has been placed. Nothing further needed.

## 2023-02-18 ENCOUNTER — Ambulatory Visit
Admission: RE | Admit: 2023-02-18 | Discharge: 2023-02-18 | Disposition: A | Discharge status: Home / Self Care | Payer: Medicare Other | Source: Ambulatory Visit | Attending: Pulmonary Disease | Admitting: Pulmonary Disease

## 2023-02-18 DIAGNOSIS — R9389 Abnormal findings on diagnostic imaging of other specified body structures: Secondary | ICD-10-CM

## 2023-02-22 ENCOUNTER — Telehealth: Payer: Self-pay | Admitting: Pulmonary Disease

## 2023-02-22 MED ORDER — DOXYCYCLINE HYCLATE 100 MG PO TABS: 100.0000 mg | ORAL_TABLET | Freq: Two times a day (BID) | ORAL | 0 refills | Status: DC

## 2023-02-22 NOTE — Addendum Note (Signed)
Addended by: Lorretta Harp on: 02/22/2023 01:57 PM   Modules accepted: Orders

## 2023-02-22 NOTE — Telephone Encounter (Signed)
Spoke with patient in regards to CT results. Patient states she is still not feeling well. She has a fever this morning was 100.7, coughing, sob, wheezing, headache, body ache and chills. Patient has been using tessalon pearls for cough which doesn't seem to be helping.   Dr. Lake Bells can you please advise   Pharmacy [facility] on [address]

## 2023-02-22 NOTE — Telephone Encounter (Signed)
Juanito Doom, MD  to Miguel Dibble, [place]     02/22/23 11:12 AM  Hi, I sent this a few days ago but I'm guessing it wasn't seen yet.  C, Please let the patient know this showed findings of vaping related lung disease.  I'm glad she quit but she needs to be careful to not start again.  There is also emphysema.  Will discuss more next visit. Thanks, B  If she is having fever and cough then I'd like her to take doxycycline '100mg'$  po bid x 5 days  [facility] and spoke with pt letting her know results per BQ and recs and she verbalized understanding. Abx called in to pharmacy for pt. Nothing further needed.

## 2023-02-22 NOTE — Telephone Encounter (Signed)
Patient is calling to get the results of her CT.  She stated she is still sick and has not heard anything since her CT on Monday.  Please advise and call patient with results.  CB# [phone]

## 2023-02-28 ENCOUNTER — Telehealth: Payer: Self-pay | Admitting: Pulmonary Disease

## 2023-02-28 MED ORDER — DOXYCYCLINE HYCLATE 100 MG PO TABS: 100.0000 mg | ORAL_TABLET | Freq: Two times a day (BID) | ORAL | 0 refills | Status: DC

## 2023-02-28 NOTE — Addendum Note (Signed)
Addended by: Priscille Kluver on: 02/28/2023 03:03 PM   Modules accepted: Orders

## 2023-02-28 NOTE — Telephone Encounter (Signed)
Called and spoke w/ pt she states that she is feeling slightly better from the tele encounter on 2/23 but still struggling. She reports that she is still running a low grade fever @ 12pm it was 99.6 she took tylenol. She is also still coughing w/ some clear mucous in the morning.   She finished doxycycline prescribed by Dr.McQuaid she is now wanting more of that antibiotic or a recommendation on what she needs to do now that she isn't getting better.   Please advise.

## 2023-02-28 NOTE — Telephone Encounter (Signed)
Called and spoke w/ pt sent in repeat prescription per Dr.McQuaid. She verbalized that if she isn't better after this course she will call for an appt. NFN att.

## 2023-02-28 NOTE — Telephone Encounter (Signed)
Patient called to request another refill for her doxycycline (VIBRA-TABS) 100 MG tablet   She stated that she ran out of the medication and still has the symptoms.  Please advise and call patient to let her know.  CB# [phone]

## 2023-09-27 NOTE — Progress Notes (Unsigned)
Brittney Santos, female    DOB: [birth year]   MRN: [id number]   Brief patient profile:  67  yowf quit smoking 2013 self referred to pulmonary clinic 09/30/2023         [facility] eval 02/07/23  Referred in December 2023 for dyspnea, noted to have centrilobular emphysema on a CT chest. Former cigarette smoker.  Formerly smoked 1ppd for 39 years.  Quit in 2013.  Stopped vaping in 11/2022 with no copd on pfts rec combivent prn but never purchased it.    History of Present Illness  09/30/2023  Pulmonary/ 1st office eval/Brittney Santos  Chief Complaint  Patient presents with   Follow-up   Consult    Pt does see ent, did get diagnosed with gerd. Breathing has been fine. No inhaler usage   Dyspnea:  no problem with steps/ walk neighborhood x 5 min more tired than sob  Cough: none  Sleep: bed is level/ on side with 2 pillows  SABA use: none  No obvious day to day or daytime pattern/variability or assoc excess/ purulent sputum or mucus plugs or hemoptysis or cp or chest tightness, subjective wheeze or overt sinus or hb symptoms.    Also denies any obvious fluctuation of symptoms with weather or environmental changes or other aggravating or alleviating factors except as outlined above   No unusual exposure hx or h/o childhood pna/ asthma or knowledge of premature birth.  Current Allergies, Complete Past Medical History, Past Surgical History, Family History, and Social History were reviewed in [employer] record.  ROS  The following are not active complaints unless bolded Hoarseness, sore throat, dysphagia, dental problems, itching, sneezing,  nasal congestion or discharge of excess mucus or purulent secretions, ear ache,   fever, chills, sweats, unintended wt loss or wt gain, classically pleuritic or exertional cp,  orthopnea pnd or arm/hand swelling  or leg swelling, presyncope, palpitations, abdominal pain, anorexia, nausea, vomiting, diarrhea  or change in bowel habits or change in  bladder habits, change in stools or change in urine, dysuria, hematuria,  rash, arthralgias, visual complaints, headache, numbness, weakness or ataxia or problems with walking or coordination,  change in mood or  memory.             Outpatient Medications Prior to Visit  Medication Sig Dispense Refill   aspirin EC 81 MG tablet Take 81 mg by mouth daily. Swallow whole. (Patient not taking: Reported on 12/06/2022)     benzonatate (TESSALON) 200 MG capsule Take 1 capsule (200 mg total) by mouth 3 (three) times daily as needed for cough. 30 capsule 1   doxycycline (VIBRA-TABS) 100 MG tablet Take 1 tablet (100 mg total) by mouth 2 (two) times daily. 10 tablet 0   Ipratropium-Albuterol (COMBIVENT RESPIMAT) 20-100 MCG/ACT AERS respimat Inhale 1 puff into the lungs every 6 (six) hours. 4 g 5   nitroGLYCERIN (NITROSTAT) 0.4 MG SL tablet Place 1 tablet (0.4 mg total) under the tongue every 5 (five) minutes as needed for chest pain. (Patient not taking: Reported on 12/06/2022) 90 tablet 3   rosuvastatin (CRESTOR) 5 MG tablet Take 1 tablet (5 mg total) by mouth at bedtime. (Patient not taking: Reported on 12/06/2022) 30 tablet 2   No facility-administered medications prior to visit.    Past Medical History:  Diagnosis Date   Anal condyloma    Arthritis    Chronic diarrhea    S/P  CHOLECYSTECTOMY   GERD (gastroesophageal reflux disease)    WATCHES DIET  History of basal cell carcinoma excision    X2   FROM  NOSE   History of cervical cancer    DX  1989--  S/P  TAH WITH BSO IN 1999   History of colon polyps    History of kidney stones    Renal calculus, left    2011      Objective:     BP (!) 95/57   Pulse 77   Ht 5' 4.5" (1.638 m)   Wt 139 lb (63 kg)   SpO2 97%   BMI 23.49 kg/m   SpO2: 97 % amb wf nad    HEENT : Oropharynx  clear  Nasal turbinates nl    NECK :  without  apparent JVD/ palpable Nodes/TM    LUNGS: no acc muscle use,  Min barrel  contour chest wall with  bilateral  slightly decreased bs s audible wheeze and  without cough on insp or exp maneuvers and min  Hyperresonant  to  percussion bilaterally    CV:  RRR  no s3 or murmur or increase in P2, and no edema   ABD:  soft and nontender with pos end  insp Hoover's  in the supine position.  No bruits or organomegaly appreciated   MS:  Nl gait/ ext warm without deformities Or obvious joint restrictions  calf tenderness, cyanosis or clubbing     SKIN: warm and dry with no herpectic lesions or scars around inferior portion of nose/upper lip   NEURO:  alert, approp, nl sensorium with  no motor or cerebellar deficits apparent.          Chest imaging: 2023 lung cancer screening CT scan images independently reviewed showing upper lobe predominant centrilobular emphysema, RADS 2   PFT: January 2024 ratio 72%, FVC 2.43 L 73% predicted, total lung capacity 4.72 L 90% predicted, DLCO 12.14 59% predicted   Labs: 11/2022 alpha 1> normal   Path:   Echo: October 2023 normal LVEF, normal RV size and function, mild MR, mild TR   Heart Catheterization:     Stress test: October 2023 nuclear stress test LVEF 68%, normal perfusion    Assessment   COPD GOLD 0 with emphysema on CT Quit smoking 2013 with no copd on pfts Jan 2024     I reviewed the Fletcher curve with the patient that basically indicates that  if you quit smoking when your best day FEV1 is still well preserved (as is clearly   the case here)  it is highly unlikely you will progress to severe disease and informed the patient there was  no medication on the market that has proven to alter the curve/ its downward trajectory  or the likelihood of progression of their disease(unlike other chronic medical conditions such as atheroclerosis where we do think we can change the natural hx with risk reducing meds)    Therefore maintaining abstinence from cigs is  the most important aspec  of her  care, not choice of inhalers or for that matter,  Pulmonary doctors.   Treatment other than smoking cessation  is entirely directed by severity of symptoms and focused also on reducing exacerbations, not attempting to change the natural history of the disease. Should either issue become problematic, then escalation of maintenance and prn resp meds may be warranted "the punishment needs to fit the crime"    F/u can be prn   Former cigarette smoker Rec f/u with pcp for annual LDSCT due in Feb q year until  2028 , 15 y p quit   HSV-1 (herpes simplex virus 1) infection Severe oubreak under nose 3-5 days p nose pinchers used for pfts > not treated, resolved on its own  She has had freq fever blisters but nothing as bad as above which is classic via her photo of HSV1 or zoster infection.  Rec f/u PCP or derm as likely to recur and can be treated early with antivirals when it does   Discussed in detail all the  indications, usual  risks and alternatives  relative to the benefits with patient who agrees to proceed with Rx as outlined.             Each maintenance medication was reviewed in detail including emphasizing most importantly the difference between maintenance and prns and under what circumstances the prns are to be triggered using an action plan format where appropriate.  Total time for H and P, chart review, counseling,  and generating customized AVS unique to this office visit / same day charting = 40 min office eval with pt new to me.          Sandrea Hughs, MD 09/30/2023

## 2023-09-30 ENCOUNTER — Encounter: Payer: Self-pay | Admitting: Internal Medicine

## 2023-09-30 ENCOUNTER — Ambulatory Visit (INDEPENDENT_AMBULATORY_CARE_PROVIDER_SITE_OTHER): Payer: Medicare Other | Admitting: Internal Medicine

## 2023-09-30 VITALS — BP 95/57 | HR 77 | Ht 64.5 in | Wt 139.0 lb

## 2023-09-30 DIAGNOSIS — B009 Herpesviral infection, unspecified: Secondary | ICD-10-CM | POA: Insufficient documentation

## 2023-09-30 DIAGNOSIS — Z87891 Personal history of nicotine dependence: Secondary | ICD-10-CM

## 2023-09-30 DIAGNOSIS — J449 Chronic obstructive pulmonary disease, unspecified: Secondary | ICD-10-CM

## 2023-09-30 NOTE — Assessment & Plan Note (Signed)
Severe oubreak under nose 3-5 days p nose pinchers used for pfts > not treated, resolved on its own  She has had freq fever blisters but nothing as bad as above which is classic via her photo of HSV1 or zoster infection.  Rec f/u PCP or derm as likely to recur and can be treated early with antivirals when it does   Discussed in detail all the  indications, usual  risks and alternatives  relative to the benefits with patient who agrees to proceed with Rx as outlined.             Each maintenance medication was reviewed in detail including emphasizing most importantly the difference between maintenance and prns and under what circumstances the prns are to be triggered using an action plan format where appropriate.  Total time for H and P, chart review, counseling,  and generating customized AVS unique to this office visit / same day charting = 40 min office eval with pt new to me.

## 2023-09-30 NOTE — Patient Instructions (Addendum)
Your condition is called Herpes simplex 1 or Herpes Zoster and is very likely to recur so I strongly rec you see your PCP or a dermatologist if it recurs.     GERD (REFLUX)  is an extremely common cause of respiratory symptoms just like yours , many times with no obvious heartburn at all.    It can be treated with medication, but also with lifestyle changes including elevation of the head of your bed (ideally with 6 -8inch blocks under the headboard of your bed),  Smoking cessation, avoidance of late meals, excessive alcohol, and avoid fatty foods, chocolate, peppermint, colas, red wine, and acidic juices such as orange juice.  NO MINT OR MENTHOL PRODUCTS SO NO COUGH DROPS - LUDENS USE SUGARLESS CANDY INSTEAD (Jolley ranchers or Stover's or Environmental manager) or even ice chips will also do - the key is to swallow to prevent all throat clearing. NO OIL BASED VITAMINS - use powdered substitutes.  Avoid fish oil when coughing.    You do not have significant copd and you never will  - return here for persistent cough or breathing problems.

## 2023-09-30 NOTE — Assessment & Plan Note (Addendum)
Rec f/u with pcp for annual LDSCT due in Feb q year until 2028 , 15 y p quit

## 2023-09-30 NOTE — Assessment & Plan Note (Signed)
Quit smoking 2013 with no copd on pfts Jan 2024     I reviewed the Fletcher curve with the patient that basically indicates that  if you quit smoking when your best day FEV1 is still well preserved (as is clearly   the case here)  it is highly unlikely you will progress to severe disease and informed the patient there was  no medication on the market that has proven to alter the curve/ its downward trajectory  or the likelihood of progression of their disease(unlike other chronic medical conditions such as atheroclerosis where we do think we can change the natural hx with risk reducing meds)    Therefore maintaining abstinence from cigs is  the most important aspec  of her  care, not choice of inhalers or for that matter, Pulmonary doctors.   Treatment other than smoking cessation  is entirely directed by severity of symptoms and focused also on reducing exacerbations, not attempting to change the natural history of the disease. Should either issue become problematic, then escalation of maintenance and prn resp meds may be warranted "the punishment needs to fit the crime"    F/u can be prn

## 2023-11-08 ENCOUNTER — Ambulatory Visit: Payer: Medicare Other | Admitting: Internal Medicine

## 2023-11-11 ENCOUNTER — Ambulatory Visit: Payer: Self-pay | Admitting: Cardiology

## 2024-01-22 ENCOUNTER — Ambulatory Visit: Payer: Medicare Other | Attending: Internal Medicine | Admitting: Cardiology

## 2024-01-22 ENCOUNTER — Encounter: Payer: Self-pay | Admitting: Cardiology

## 2024-01-22 VITALS — BP 100/60 | HR 95 | Resp 16 | Ht 64.0 in | Wt 141.2 lb

## 2024-01-22 DIAGNOSIS — R0609 Other forms of dyspnea: Secondary | ICD-10-CM

## 2024-01-22 DIAGNOSIS — I951 Orthostatic hypotension: Secondary | ICD-10-CM | POA: Diagnosis not present

## 2024-01-22 DIAGNOSIS — I700 Atherosclerosis of aorta: Secondary | ICD-10-CM | POA: Diagnosis not present

## 2024-01-22 NOTE — Progress Notes (Signed)
Cardiology Office Note:  .   Date:  01/22/2024  ID:  Brittney Santos, DOB 1956-02-23, MRN [id number] PCP: Ralene Ok, MD  [facility] Providers Cardiologist:  Yates Decamp, MD   History of Present Illness: .   Brittney Santos is a 68 y.o. Patient with tobacco use disorder, dyspnea on exertion, aortic atherosclerosis noted on low-dose CT scan for lung cancer screening, kidney stones was seen by cardiology 1 year ago for dyspnea, cardiac testing done in September 2023 including echocardiogram revealing mild to moderate MR, nuclear perfusion stress revealing markedly reduced exercise tolerance at <3 minutes however no evidence of ischemia and normal EF.  Coronary CT angiogram revealed coronary calcium score of 0 with no evidence of CAD.  She now presents for follow-up.  She was also noted to have orthostatic hypotension on her prior office visit.  Discussed the use of AI scribe software for clinical note transcription with the patient, who gave verbal consent to proceed.  History of Present Illness   The patient, with a history of smoking and vaping, presents for a follow-up visit after a recent hospital admission for chest pain and shortness of breath. She reports that these symptoms began after a CT scan and were associated with fatigue and difficulty walking, as if she had 'ankle weights on.' She denies current smoking or vaping, having quit three years ago. She also reports a history of anemia and recent colonoscopy with polyp removal. She has been feeling well since her last visit, with no recurrence of chest pain or shortness of breath. She lives with her sister and is able to navigate her two-story home without difficulty, but expresses a desire to increase her physical activity.       Labs    Lab Results  Component Value Date   NA 143 10/08/2022   K 4.3 10/08/2022   CO2 21 10/08/2022   GLUCOSE 104 (H) 10/08/2022   BUN 13 10/08/2022   CREATININE 0.81 10/08/2022   CALCIUM 9.5  10/08/2022   EGFR 81 10/08/2022   GFRNONAA >60 08/10/2018      Latest Ref Rng & Units 10/08/2022    9:36 AM 08/10/2018    3:59 AM 08/09/2018    4:28 AM  BMP  Glucose 70 - 99 mg/dL 644  034  742   BUN 8 - 27 mg/dL 13  10  13    Creatinine 0.57 - 1.00 mg/dL 5.95  6.38  7.56   BUN/Creat Ratio 12 - 28 16     Sodium 134 - 144 mmol/L 143  143  139   Potassium 3.5 - 5.2 mmol/L 4.3  3.6  3.6   Chloride 96 - 106 mmol/L 102  113  109   CO2 20 - 29 mmol/L 21  24  25    Calcium 8.7 - 10.3 mg/dL 9.5  8.2  8.2    External Labs:  07/23/2022 Total cholesterol 192 HDL 91 TG's 59 LDL 87   Creatining 0.77, eGFR 86, K+ 4.6  Review of Systems  Cardiovascular:  Positive for dyspnea on exertion (minimal). Negative for chest pain and leg swelling.    Physical Exam:   VS:  BP 100/60 (BP Location: Left Arm, Patient Position: Sitting, Cuff Size: Normal)   Pulse 95   Resp 16   Ht 5\' 4"  (1.626 m)   Wt 141 lb 3.2 oz (64 kg)   SpO2 98%   BMI 24.24 kg/m    Wt Readings from Last 3 Encounters:  01/22/24 141 lb  3.2 oz (64 kg)  09/30/23 139 lb (63 kg)  02/07/23 135 lb 6.4 oz (61.4 kg)     Physical Exam Neck:     Vascular: No carotid bruit or JVD.  Cardiovascular:     Rate and Rhythm: Normal rate and regular rhythm.     Pulses: Intact distal pulses.     Heart sounds: Normal heart sounds. No murmur heard.    No gallop.  Pulmonary:     Effort: Pulmonary effort is normal.     Breath sounds: Normal breath sounds.  Abdominal:     General: Bowel sounds are normal.     Palpations: Abdomen is soft.  Musculoskeletal:     Right lower leg: No edema.     Left lower leg: No edema.     Studies Reviewed: .    10/22/2022 CCTA IMPRESSION: 1. Total coronary calcium score of 0. 2. Normal coronary origin with co-dominance. 3. CAD-RADS = 0 but myocardial bridge within the mid-LAD cannot be ruled out. 4.  Pectus excavatum deformity present.  Echocardiogram 10/08/2022: Normal LV systolic function with  visual EF 55-60%. Left ventricle cavity is normal in size. Normal global wall motion. Normal diastolic filling pattern, normal LAP. Calculated EF 58%. Structurally normal mitral valve.  Mild to moderate mitral regurgitation. Structurally normal tricuspid valve with no regurgitation. No evidence of pulmonary hypertension. Structurally normal pulmonic valve.  Mild pulmonic regurgitation. No prior available for comparison.   EKG:    EKG Interpretation Date/Time:  Wednesday January 22 2024 13:09:29 EST Ventricular Rate:  72 PR Interval:  140 QRS Duration:  76 QT Interval:  364 QTC Calculation: 398 R Axis:   67  Text Interpretation: EKG 01/22/2024: Normal sinus rhythm at rate of 72 bpm, normal EKG. Confirmed by Delrae Rend [phone]) on 01/22/2024 1:16:51 PM     Medications and allergies    Allergies  Allergen Reactions   Lactose Intolerance (Gi)    Penicillins Itching    Has patient had a PCN reaction causing immediate rash, facial/tongue/throat swelling, SOB or lightheadedness with hypotension: No Has patient had a PCN reaction causing severe rash involving mucus membranes or skin necrosis: No Has patient had a PCN reaction that required hospitalization: No Has patient had a PCN reaction occurring within the last 10 years: No If all of the above answers are "NO", then may proceed with Cephalosporin use.     No current outpatient medications on file.   ASSESSMENT AND PLAN: .      ICD-10-CM   1. Dyspnea on exertion  R06.09 EKG 12-Lead    2. Orthostatic hypotension  I95.1     3. Aortic atherosclerosis (HCC)  I70.0       Assessment and Plan    Mild aortic atherosclerosis Mild thickening noted on CT coronary angiogram, likely age-appropriate and related to past smoking history. No current symptoms of chest pain or shortness of breath. Calcium score of zero, normal coronary arteries by coronary CTA, indicating low risk of heart attack or stroke. -No need for daily aspirin  or other medications at this time.  She also has chronic anemia as well. -Encourage continued abstinence from smoking and vaping. -Recommend regular exercise, particularly walking, to improve lung function and overall health.  Anemia Chronic, mild anemia noted. Recent colonoscopy with polyp removal. -No specific plan discussed in the conversation.  Dyspnea on exertion has remained stable, related to underlying COPD.  General Health Maintenance -Continue regular screenings as appropriate for age and health history. -Consider adding regular  exercise equipment (e.g., elliptical or stationary bike) at home for convenience and to encourage regular physical activity.            Signed,  Yates Decamp, MD, [facility] 01/22/2024, 1:34 PM [facility] [address] [place], [place] [address] Phone: [phone]. Fax:  [phone]

## 2024-01-22 NOTE — Patient Instructions (Signed)
Medication Instructions:  Your physician recommends that you continue on your current medications as directed. Please refer to the Current Medication list given to you today.  *If you need a refill on your cardiac medications before your next appointment, please call your pharmacy*   Lab Work: none If you have labs (blood work) drawn today and your tests are completely normal, you will receive your results only by: MyChart Message (if you have MyChart) OR A paper copy in the mail If you have any lab test that is abnormal or we need to change your treatment, we will call you to review the results.   Testing/Procedures: none   Follow-Up: At [facility], you and your health needs are our priority.  As part of our continuing mission to provide you with exceptional heart care, we have created designated Provider Care Teams.  These Care Teams include your primary Cardiologist (physician) and Advanced Practice Providers (APPs -  Physician Assistants and Nurse Practitioners) who all work together to provide you with the care you need, when you need it.  We recommend signing up for the patient portal called "MyChart".  Sign up information is provided on this After Visit Summary.  MyChart is used to connect with patients for Virtual Visits (Telemedicine).  Patients are able to view lab/test results, encounter notes, upcoming appointments, etc.  Non-urgent messages can be sent to your provider as well.   To learn more about what you can do with MyChart, go to [email or web address].    Your next appointment:   As needed  Provider:   Yates Decamp, MD     Other Instructions

## 2024-01-28 LAB — LAB REPORT - SCANNED
A1c: 5.5
EGFR: 89

## 2024-02-01 NOTE — Progress Notes (Signed)
Labs 01/27/2024:  Total cholesterol 216, triglycerides 74, HDL 95, LDL 105.  Non-HDL cholesterol 121.  Serum glucose 1 1 8  mg, BUN 14, creatinine 0.74, EGFR 69 mL.  LFTs normal.  Hb 12.3/HCT 35.7, platelets 200 80K.

## 2024-06-10 ENCOUNTER — Other Ambulatory Visit: Payer: Self-pay | Admitting: Internal Medicine

## 2024-06-10 DIAGNOSIS — Z1231 Encounter for screening mammogram for malignant neoplasm of breast: Secondary | ICD-10-CM

## 2024-06-18 ENCOUNTER — Ambulatory Visit
Admission: RE | Admit: 2024-06-18 | Discharge: 2024-06-18 | Disposition: A | Discharge status: Home / Self Care | Source: Ambulatory Visit | Attending: Internal Medicine | Admitting: Internal Medicine

## 2024-06-18 DIAGNOSIS — Z1231 Encounter for screening mammogram for malignant neoplasm of breast: Secondary | ICD-10-CM
